# Patient Record
Sex: Female | Born: 1950 | Race: White | Hispanic: No | Marital: Married | Smoking: Never smoker
Health system: Southern US, Community
[De-identification: ages and names within clinical notes are randomized; demographics above are authoritative.]

## PROBLEM LIST (undated history)

## (undated) DIAGNOSIS — R51 Headache: Secondary | ICD-10-CM

## (undated) DIAGNOSIS — R519 Headache, unspecified: Secondary | ICD-10-CM

## (undated) DIAGNOSIS — G939 Disorder of brain, unspecified: Secondary | ICD-10-CM

## (undated) DIAGNOSIS — F329 Major depressive disorder, single episode, unspecified: Secondary | ICD-10-CM

## (undated) DIAGNOSIS — M810 Age-related osteoporosis without current pathological fracture: Secondary | ICD-10-CM

## (undated) DIAGNOSIS — S065XAA Traumatic subdural hemorrhage with loss of consciousness status unknown, initial encounter: Secondary | ICD-10-CM

## (undated) DIAGNOSIS — S065X9A Traumatic subdural hemorrhage with loss of consciousness of unspecified duration, initial encounter: Secondary | ICD-10-CM

## (undated) DIAGNOSIS — F419 Anxiety disorder, unspecified: Secondary | ICD-10-CM

## (undated) DIAGNOSIS — F32A Depression, unspecified: Secondary | ICD-10-CM

## (undated) HISTORY — PX: TONSILLECTOMY: SUR1361

---

## 2016-08-09 DIAGNOSIS — Z124 Encounter for screening for malignant neoplasm of cervix: Secondary | ICD-10-CM | POA: Diagnosis not present

## 2016-08-09 DIAGNOSIS — N952 Postmenopausal atrophic vaginitis: Secondary | ICD-10-CM | POA: Diagnosis not present

## 2016-08-09 DIAGNOSIS — Z681 Body mass index (BMI) 19 or less, adult: Secondary | ICD-10-CM | POA: Diagnosis not present

## 2016-08-14 DIAGNOSIS — L309 Dermatitis, unspecified: Secondary | ICD-10-CM | POA: Diagnosis not present

## 2016-09-01 DIAGNOSIS — M810 Age-related osteoporosis without current pathological fracture: Secondary | ICD-10-CM | POA: Diagnosis not present

## 2016-11-22 DIAGNOSIS — Z136 Encounter for screening for cardiovascular disorders: Secondary | ICD-10-CM | POA: Diagnosis not present

## 2016-11-22 DIAGNOSIS — Z23 Encounter for immunization: Secondary | ICD-10-CM | POA: Diagnosis not present

## 2016-11-22 DIAGNOSIS — M810 Age-related osteoporosis without current pathological fracture: Secondary | ICD-10-CM | POA: Diagnosis not present

## 2016-11-22 DIAGNOSIS — Z0000 Encounter for general adult medical examination without abnormal findings: Secondary | ICD-10-CM | POA: Diagnosis not present

## 2016-11-22 DIAGNOSIS — Z131 Encounter for screening for diabetes mellitus: Secondary | ICD-10-CM | POA: Diagnosis not present

## 2017-01-15 DIAGNOSIS — M810 Age-related osteoporosis without current pathological fracture: Secondary | ICD-10-CM | POA: Diagnosis not present

## 2017-01-15 DIAGNOSIS — Z1231 Encounter for screening mammogram for malignant neoplasm of breast: Secondary | ICD-10-CM | POA: Diagnosis not present

## 2017-05-08 DIAGNOSIS — F419 Anxiety disorder, unspecified: Secondary | ICD-10-CM | POA: Diagnosis not present

## 2017-05-08 DIAGNOSIS — F410 Panic disorder [episodic paroxysmal anxiety] without agoraphobia: Secondary | ICD-10-CM | POA: Diagnosis not present

## 2017-05-08 DIAGNOSIS — R42 Dizziness and giddiness: Secondary | ICD-10-CM | POA: Diagnosis not present

## 2017-05-14 DIAGNOSIS — H2513 Age-related nuclear cataract, bilateral: Secondary | ICD-10-CM | POA: Diagnosis not present

## 2017-05-14 DIAGNOSIS — H16143 Punctate keratitis, bilateral: Secondary | ICD-10-CM | POA: Diagnosis not present

## 2017-05-14 DIAGNOSIS — H25013 Cortical age-related cataract, bilateral: Secondary | ICD-10-CM | POA: Diagnosis not present

## 2017-06-12 DIAGNOSIS — E7800 Pure hypercholesterolemia, unspecified: Secondary | ICD-10-CM | POA: Diagnosis not present

## 2017-06-12 DIAGNOSIS — F419 Anxiety disorder, unspecified: Secondary | ICD-10-CM | POA: Diagnosis not present

## 2017-06-14 DIAGNOSIS — L812 Freckles: Secondary | ICD-10-CM | POA: Diagnosis not present

## 2017-06-14 DIAGNOSIS — D229 Melanocytic nevi, unspecified: Secondary | ICD-10-CM | POA: Diagnosis not present

## 2017-06-14 DIAGNOSIS — L738 Other specified follicular disorders: Secondary | ICD-10-CM | POA: Diagnosis not present

## 2017-06-14 DIAGNOSIS — L814 Other melanin hyperpigmentation: Secondary | ICD-10-CM | POA: Diagnosis not present

## 2017-07-25 DIAGNOSIS — F419 Anxiety disorder, unspecified: Secondary | ICD-10-CM | POA: Diagnosis not present

## 2017-07-25 DIAGNOSIS — M810 Age-related osteoporosis without current pathological fracture: Secondary | ICD-10-CM | POA: Diagnosis not present

## 2017-07-25 DIAGNOSIS — M25552 Pain in left hip: Secondary | ICD-10-CM | POA: Diagnosis not present

## 2017-08-14 DIAGNOSIS — N952 Postmenopausal atrophic vaginitis: Secondary | ICD-10-CM | POA: Diagnosis not present

## 2017-08-14 DIAGNOSIS — N3945 Continuous leakage: Secondary | ICD-10-CM | POA: Diagnosis not present

## 2017-08-14 DIAGNOSIS — Z01419 Encounter for gynecological examination (general) (routine) without abnormal findings: Secondary | ICD-10-CM | POA: Diagnosis not present

## 2017-08-14 DIAGNOSIS — Z681 Body mass index (BMI) 19 or less, adult: Secondary | ICD-10-CM | POA: Diagnosis not present

## 2017-08-28 DIAGNOSIS — R1903 Right lower quadrant abdominal swelling, mass and lump: Secondary | ICD-10-CM | POA: Diagnosis not present

## 2017-09-07 DIAGNOSIS — H538 Other visual disturbances: Secondary | ICD-10-CM | POA: Diagnosis not present

## 2017-09-08 ENCOUNTER — Encounter (HOSPITAL_COMMUNITY): Payer: Self-pay

## 2017-09-08 ENCOUNTER — Emergency Department (HOSPITAL_COMMUNITY)
Admission: EM | Admit: 2017-09-08 | Discharge: 2017-09-08 | Disposition: A | Discharge status: Home / Self Care | Payer: Medicare Other | Attending: Emergency Medicine | Admitting: Emergency Medicine

## 2017-09-08 ENCOUNTER — Emergency Department (HOSPITAL_COMMUNITY): Payer: Medicare Other

## 2017-09-08 ENCOUNTER — Other Ambulatory Visit: Payer: Self-pay

## 2017-09-08 DIAGNOSIS — H53129 Transient visual loss, unspecified eye: Secondary | ICD-10-CM | POA: Insufficient documentation

## 2017-09-08 DIAGNOSIS — H539 Unspecified visual disturbance: Secondary | ICD-10-CM | POA: Diagnosis not present

## 2017-09-08 DIAGNOSIS — D164 Benign neoplasm of bones of skull and face: Secondary | ICD-10-CM | POA: Diagnosis not present

## 2017-09-08 DIAGNOSIS — Z79899 Other long term (current) drug therapy: Secondary | ICD-10-CM | POA: Diagnosis not present

## 2017-09-08 DIAGNOSIS — H538 Other visual disturbances: Secondary | ICD-10-CM | POA: Insufficient documentation

## 2017-09-08 DIAGNOSIS — G453 Amaurosis fugax: Secondary | ICD-10-CM | POA: Diagnosis not present

## 2017-09-08 DIAGNOSIS — H04123 Dry eye syndrome of bilateral lacrimal glands: Secondary | ICD-10-CM | POA: Diagnosis not present

## 2017-09-08 HISTORY — DX: Age-related osteoporosis without current pathological fracture: M81.0

## 2017-09-08 LAB — CBC
HEMATOCRIT: 37.4 % (ref 36.0–46.0)
HEMOGLOBIN: 12.1 g/dL (ref 12.0–15.0)
MCH: 31.3 pg (ref 26.0–34.0)
MCHC: 32.4 g/dL (ref 30.0–36.0)
MCV: 96.6 fL (ref 78.0–100.0)
Platelets: 297 10*3/uL (ref 150–400)
RBC: 3.87 MIL/uL (ref 3.87–5.11)
RDW: 11.8 % (ref 11.5–15.5)
WBC: 6.1 10*3/uL (ref 4.0–10.5)

## 2017-09-08 LAB — DIFFERENTIAL
Abs Immature Granulocytes: 0 10*3/uL (ref 0.0–0.1)
BASOS ABS: 0 10*3/uL (ref 0.0–0.1)
Basophils Relative: 0 %
Eosinophils Absolute: 0.1 10*3/uL (ref 0.0–0.7)
Eosinophils Relative: 1 %
IMMATURE GRANULOCYTES: 0 %
LYMPHS ABS: 0.9 10*3/uL (ref 0.7–4.0)
Lymphocytes Relative: 15 %
Monocytes Absolute: 0.5 10*3/uL (ref 0.1–1.0)
Monocytes Relative: 8 %
NEUTROS ABS: 4.6 10*3/uL (ref 1.7–7.7)
Neutrophils Relative %: 76 %

## 2017-09-08 LAB — I-STAT CHEM 8, ED
BUN: 16 mg/dL (ref 8–23)
CHLORIDE: 104 mmol/L (ref 98–111)
CREATININE: 0.7 mg/dL (ref 0.44–1.00)
Calcium, Ion: 1.2 mmol/L (ref 1.15–1.40)
GLUCOSE: 165 mg/dL — AB (ref 70–99)
HCT: 34 % — ABNORMAL LOW (ref 36.0–46.0)
Hemoglobin: 11.6 g/dL — ABNORMAL LOW (ref 12.0–15.0)
Potassium: 3.8 mmol/L (ref 3.5–5.1)
Sodium: 141 mmol/L (ref 135–145)
TCO2: 24 mmol/L (ref 22–32)

## 2017-09-08 LAB — COMPREHENSIVE METABOLIC PANEL
ALBUMIN: 3.7 g/dL (ref 3.5–5.0)
ALT: 16 U/L (ref 0–44)
AST: 19 U/L (ref 15–41)
Alkaline Phosphatase: 49 U/L (ref 38–126)
Anion gap: 8 (ref 5–15)
BILIRUBIN TOTAL: 0.8 mg/dL (ref 0.3–1.2)
BUN: 14 mg/dL (ref 8–23)
CHLORIDE: 106 mmol/L (ref 98–111)
CO2: 26 mmol/L (ref 22–32)
CREATININE: 0.85 mg/dL (ref 0.44–1.00)
Calcium: 9.3 mg/dL (ref 8.9–10.3)
GFR calc Af Amer: >60 mL/min (ref >60)
GFR calc non Af Amer: >60 mL/min (ref >60)
Glucose, Bld: 170 mg/dL — ABNORMAL HIGH (ref 70–99)
Potassium: 3.9 mmol/L (ref 3.5–5.1)
SODIUM: 140 mmol/L (ref 135–145)
Total Protein: 6.3 g/dL — ABNORMAL LOW (ref 6.5–8.1)

## 2017-09-08 LAB — I-STAT TROPONIN, ED: Troponin i, poc: 0.04 ng/mL (ref 0.00–0.08)

## 2017-09-08 LAB — APTT: APTT: 39 s — AB (ref 24–36)

## 2017-09-08 LAB — PROTIME-INR
INR: 1.04
Prothrombin Time: 13.5 seconds (ref 11.4–15.2)

## 2017-09-08 MED ORDER — GADOBENATE DIMEGLUMINE 529 MG/ML IV SOLN
10.0000 mL | Freq: Once | INTRAVENOUS | Status: AC | PRN
  Administered 2017-09-08: 10 mL via INTRAVENOUS

## 2017-09-08 MED ORDER — ASPIRIN 81 MG PO CHEW
81.0000 mg | CHEWABLE_TABLET | Freq: Once | ORAL | Status: AC
  Administered 2017-09-08: 81 mg via ORAL
  Filled 2017-09-08: qty 1

## 2017-09-08 NOTE — ED Provider Notes (Signed)
[facility] Provider Note   CSN: [id number] Arrival date & time: 09/08/17  1108     History   Chief Complaint Chief Complaint  Patient presents with  . Blurred Vision    HPI Jamie Kirby is a 67 y.o. female.  HPI Patient presents after an episode of blurry vision. The episode was yesterday, occurred without precipitant, lasted about 3 minutes. There was seemingly left-sided, though she is not 100% sure of that. There is no vision loss, nor double vision. She denies other concurrent changes, including extremity weakness, confusion, speech difficulty. Symptoms resolved without intervention. Today she went to her ophthalmologist, and after a full examination was sent here for additional evaluation and management. Notes from her ophthalmologist office state concern for amaurosis fugax, 20/20 vision bilaterally.  She is here with her husband who also assists with the HPI. Past Medical History:  Diagnosis Date  . Osteoporosis     There are no active problems to display for this patient.   History reviewed. No pertinent surgical history.   OB History   None      Home Medications    Prior to Admission medications   Medication Sig Start Date End Date Taking? Authorizing Provider  Hypromellose (GENTEAL MILD) 0.2 % SOLN Place 1 drop into both eyes 2 (two) times daily.   Yes [provider]  Multiple Vitamin (MULTIVITAMIN) capsule Take 1 capsule by mouth daily. Calcium and Vitamin D   Yes [provider]  raloxifene (EVISTA) 60 MG tablet Take 60 mg by mouth every other day.   Yes [provider]  sertraline (ZOLOFT) 25 MG tablet Take 25 mg by mouth at bedtime.   Yes [provider]    Family History History reviewed. No pertinent family history.  Social History Social History   Tobacco Use  . Smoking status: Never Smoker  . Smokeless tobacco: Never Used  Substance Use Topics  . Alcohol use:  Not on file  . Drug use: Not on file     Allergies   Ceclor [cefaclor]   Review of Systems Review of Systems  Constitutional:       Per HPI, otherwise negative  HENT:       Per HPI, otherwise negative  Eyes: Positive for visual disturbance.  Respiratory:       Per HPI, otherwise negative  Cardiovascular:       Per HPI, otherwise negative  Gastrointestinal: Negative for vomiting.  Endocrine:       Negative aside from HPI  Genitourinary:       Neg aside from HPI   Musculoskeletal:       Per HPI, otherwise negative  Skin: Negative.   Neurological: Negative for syncope.     Physical Exam Updated Vital Signs BP 127/76 (BP Location: Right Arm)   Pulse 82   Temp 98.4 F (36.9 C) (Oral)   Resp 16   Ht 5\' 1"  (1.549 m)   Wt 46.7 kg (103 lb)   SpO2 100%   BMI 19.46 kg/m   Physical Exam  Constitutional: She is oriented to person, place, and time. She appears well-developed and well-nourished. No distress.  HENT:  Head: Normocephalic and atraumatic.  Eyes: Conjunctivae and EOM are normal.  Cardiovascular: Normal rate and regular rhythm.  Pulmonary/Chest: Effort normal and breath sounds normal. No stridor. No respiratory distress.  Abdominal: She exhibits no distension.  Musculoskeletal: She exhibits no edema.  Neurological: She is alert and oriented to person, place,  and time. No cranial nerve deficit. She exhibits normal muscle tone. Coordination normal.  Skin: Skin is warm and dry.  Psychiatric: She has a normal mood and affect.  Nursing note and vitals reviewed.    ED Treatments / Results  Labs (all labs ordered are listed, but only abnormal results are displayed) Labs Reviewed  APTT - Abnormal; Notable for the following components:      Result Value   aPTT 39 (*)    All other components within normal limits  COMPREHENSIVE METABOLIC PANEL - Abnormal; Notable for the following components:   Glucose, Bld 170 (*)    Total Protein 6.3 (*)    All other  components within normal limits  I-STAT CHEM 8, ED - Abnormal; Notable for the following components:   Glucose, Bld 165 (*)    Hemoglobin 11.6 (*)    HCT 34.0 (*)    All other components within normal limits  PROTIME-INR  CBC  DIFFERENTIAL  I-STAT TROPONIN, ED    EKG EKG Interpretation  Date/Time:  Saturday September 08 2017 11:56:12 EDT Ventricular Rate:  90 PR Interval:    QRS Duration: 101 QT Interval:  369 QTC Calculation: 452 R Axis:   76 Text Interpretation:  Sinus rhythm RSR' in V1 or V2, right VCD or RVH Otherwise within normal limits Confirmed by Carmin Muskrat [phone]) on 09/08/2017 12:52:07 PM   Radiology Ct Head Wo Contrast  Result Date: 09/08/2017 CLINICAL DATA:  Blurred vision left eye, transient EXAM: CT HEAD WITHOUT CONTRAST TECHNIQUE: Contiguous axial images were obtained from the base of the skull through the vertex without intravenous contrast. COMPARISON:  None. FINDINGS: Brain: The ventricles are normal in size and configuration. There is bilateral frontal atrophy as well as a degree of cerebellar atrophy. There is no intracranial mass, hemorrhage, extra-axial fluid collection, or midline shift. Gray-white compartments appear normal. No acute infarct evident. Vascular: No hyperdense vessel. No appreciable vascular calcifications. Skull: The bony calvarium appears intact. Sinuses/Orbits: There is opacification of multiple ethmoid air cells posteriorly on the right. There is mild mucosal thickening in the anterior right maxillary antrum. Other visualized paranasal sinuses are clear. Visualized orbits appear symmetric bilaterally. Other: Mastoid air cells are clear. IMPRESSION: Frontal lobe and cerebellar atrophy. Ventricles normal in size and configuration. No mass or hemorrhage. Gray-white compartments appear normal. Areas of paranasal sinus disease noted. Electronically Signed   By: Lowella Grip III M.D.   On: 09/08/2017 11:54    Procedures Procedures (including  critical care time)  Medications Ordered in ED Medications - No data to display   Initial Impression / Assessment and Plan / ED Course  I have reviewed the triage vital signs and the nursing notes.  Pertinent labs & imaging results that were available during my care of the patient were reviewed by me and considered in my medical decision making (see chart for details).    After my initial evaluation I discussed the patient's case with our neurology colleagues. We discussed the unusual presentation, concern for amaurosis fugax versus transient blurry vision. Patient remains without neurologic complaints, without vision changes currently. Recommendation is for MR, MRA head and neck, and if findings are normal patient can be discharged with outpatient neurology follow-up, [facility] care follow-up for echocardiogram. Patient will start aspirin, 81 mg, regardless.   3:53 PM Patient remains awake and alert, no neuro complaints, no neuro deficits. I discussed findings thus far with the patient and her husband. Patient is awaiting MRI, understands disposition possibilities with admission  for abnormal MRI, discharge with normal MRI/MRA, with outpatient neurology and primary care follow-up. Dr. Reather Converse will follow-up on MRI results and dispo patient.  Final Clinical Impressions(s) / ED Diagnoses  Vision change   Carmin Muskrat, MD 09/08/17 1554

## 2017-09-08 NOTE — ED Notes (Signed)
Patient transported to MRI 

## 2017-09-08 NOTE — ED Triage Notes (Signed)
Pt presents with sudden onset of blurred vision to L eye that occurred yesterday.  Pt was seen at [facility] and by her ophthalmologist this morning and referred here to t/o ischemia.  Pt reports blurred vision lasted approximately 2-3 minutes, denies any at present.

## 2017-09-08 NOTE — Discharge Instructions (Addendum)
As discussed, today's evaluation has been generally reassuring, but with episode of visual disturbance it is important he follow-up with your primary care physician, and arrange an echocardiogram this week. Have MRI or PET scan of brain done in 4 to 6 wks.   Please continue taking aspirin, 81 mg daily.  Return here for concerning changes in your condition.  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 Short-term follow-up brain MRI (without and with contrast) is recommended in 4-6 weeks. Additionally, if the patient has a history of malignancy, nuclear medicine bone scan is recommended to evaluate for skeletal metastatic disease.

## 2017-09-08 NOTE — ED Notes (Signed)
Patient transported to CT 

## 2017-09-08 NOTE — ED Notes (Signed)
Pt sent from ophthalmologist office-- sent there from private dr's office-- seen yesterday at [facility] after having blurry vision - 1 episode - no visual diificulty since then.

## 2017-09-08 NOTE — ED Notes (Addendum)
Pt transported to CT ?

## 2017-09-08 NOTE — ED Notes (Signed)
Patient passed swallow test.  

## 2017-09-11 DIAGNOSIS — G453 Amaurosis fugax: Secondary | ICD-10-CM | POA: Diagnosis not present

## 2017-09-11 DIAGNOSIS — M899 Disorder of bone, unspecified: Secondary | ICD-10-CM | POA: Diagnosis not present

## 2017-09-12 ENCOUNTER — Other Ambulatory Visit (HOSPITAL_COMMUNITY): Payer: Self-pay | Admitting: Family Medicine

## 2017-09-12 DIAGNOSIS — H547 Unspecified visual loss: Secondary | ICD-10-CM

## 2017-09-13 ENCOUNTER — Ambulatory Visit (HOSPITAL_COMMUNITY)
Admission: RE | Admit: 2017-09-13 | Discharge: 2017-09-13 | Disposition: A | Discharge status: Home / Self Care | Payer: Medicare Other | Source: Ambulatory Visit | Attending: Family Medicine | Admitting: Family Medicine

## 2017-09-13 DIAGNOSIS — H547 Unspecified visual loss: Secondary | ICD-10-CM

## 2017-09-13 DIAGNOSIS — I340 Nonrheumatic mitral (valve) insufficiency: Secondary | ICD-10-CM | POA: Diagnosis not present

## 2017-09-13 DIAGNOSIS — G453 Amaurosis fugax: Secondary | ICD-10-CM | POA: Diagnosis not present

## 2017-09-13 NOTE — Progress Notes (Signed)
  Echocardiogram 2D Echocardiogram has been performed.  Jamie Kirby Jamie Kirby 09/13/2017, 9:30 AM

## 2017-09-18 ENCOUNTER — Telehealth: Payer: Self-pay | Admitting: Neurology

## 2017-09-18 ENCOUNTER — Ambulatory Visit (INDEPENDENT_AMBULATORY_CARE_PROVIDER_SITE_OTHER): Payer: Medicare Other | Admitting: Neurology

## 2017-09-18 ENCOUNTER — Encounter: Payer: Self-pay | Admitting: Neurology

## 2017-09-18 VITALS — BP 104/66 | HR 84 | Ht 61.5 in | Wt 102.0 lb

## 2017-09-18 DIAGNOSIS — G43109 Migraine with aura, not intractable, without status migrainosus: Secondary | ICD-10-CM

## 2017-09-18 DIAGNOSIS — H539 Unspecified visual disturbance: Secondary | ICD-10-CM | POA: Diagnosis not present

## 2017-09-18 DIAGNOSIS — R9089 Other abnormal findings on diagnostic imaging of central nervous system: Secondary | ICD-10-CM

## 2017-09-18 MED ORDER — ALPRAZOLAM 0.5 MG PO TABS: ORAL_TABLET | ORAL | 0 refills | Status: AC

## 2017-09-18 NOTE — Patient Instructions (Signed)
Your exam looks good.  For completion, we will repeat your brain scan, called MRI and call you with the test results. We will have to schedule you for this on a separate date. This test requires authorization from your insurance, and we will take care of the insurance process. I would suggest you get your cholesterol checked with your primary care doctor and continue with the baby aspirin.

## 2017-09-18 NOTE — Telephone Encounter (Signed)
Correction patient is scheduled at [facility] for 10/19/17 arrival time is 10:30 AM. Patient is aware of time & day. The scheduler at [facility] informed me that a BUN and Creatine lab order needs to be put in and they will check that the day of her appointment.

## 2017-09-18 NOTE — Progress Notes (Signed)
Subjective:    Patient ID: Jamie Kirby is a 67 y.o. female.  HPI     [name], MD, [name] [facility] [address], [address]O. [place], [place] [address]  Dear Dr. Lynelle Doctor,   I saw your patient, Jamie Kirby, upon your kind request, in my neurologic clinic today for initial consultation of her Hx of suspected TIA with suspected amaurosis fugax. The patient is accompanied by her husband today. As you know, Jamie Kirby is a 67 year old right-handed woman with an underlying medical history of osteoporosis and anxiety, who recently presented to the emergency room with transient blurry vision. She reports very brief symptoms that happen on a Friday. She is not sure if it was only her left eye or left visual fields. Symptoms lasted for less than 5 minutes, maybe around 3 minutes and she describes not actual blurriness or loss of vision but a skewed vision. She did not have any double vision and did not show any shadows of blackness or abnormal scintillating scotomata. She has had visual auras before and used to have migraines but also infrequently gets visual symptoms without a subsequent headache. She did not have any headache that time and feels that her eye symptoms were different from her visual auras in the past. She had no one-sided weakness or numbness or slurring of speech or droopy face. She presented to her eye doctor the next day which was a Saturday and was prompted by him to seek immediate medical attention at the emergency room. I reviewed the emergency room records. She had a MRI and MRA head on 09/08/2017 without contrast. She had an unremarkable brain MRI, was found to have a left occipital skull lesion of unknown significance, negative MRA head. 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 She had a recent echocardiogram on 09/13/2017 which showed normal LV function and mild MR. She was advised to  start on baby aspirin. Lab work and EKG in the ER where unremarkable. I reviewed your office note from 09/11/2017. She is a nonsmoker. Retired and lives with her husband. They have 2 children. She drinks alcohol socially and caffeine in the form of coffee, 2 cups per day on average.  She feels well, reports that she previously had an MRI some 10 years ago or 12 years ago, no abnormality noted per her report at the time.  Her Past Medical History Is Significant For: Past Medical History:  Diagnosis Date  . Osteoporosis     Her Past Surgical History Is Significant For: Past Surgical History:  Procedure Laterality Date  . TONSILLECTOMY      Her Family History Is Significant For: No family history on file.  Her Social History Is Significant For: Social History   Socioeconomic History  . Marital status: Married    Spouse name: Not on file  . Number of children: Not on file  . Years of education: Not on file  . Highest education level: Not on file  Occupational History  . Not on file  Social Needs  . Financial resource strain: Not on file  . Food insecurity:    Worry: Not on file    Inability: Not on file  . Transportation needs:    Medical: Not on file    Non-medical: Not on file  Tobacco Use  . Smoking status: Never Smoker  . Smokeless tobacco: Never Used  Substance and Sexual Activity  .  Alcohol use: Not on file  . Drug use: Not on file  . Sexual activity: Not on file  Lifestyle  . Physical activity:    Days per week: Not on file    Minutes per session: Not on file  . Stress: Not on file  Relationships  . Social connections:    Talks on phone: Not on file    Gets together: Not on file    Attends religious service: Not on file    Active member of club or organization: Not on file    Attends meetings of clubs or organizations: Not on file    Relationship status: Not on file  Other Topics Concern  . Not on file  Social History Narrative  . Not on file    Her  Allergies Are:  Allergies  Allergen Reactions  . Ceclor [Cefaclor] Rash  :   Her Current Medications Are:  Outpatient Encounter Medications as of 09/18/2017  Medication Sig  . Hypromellose (GENTEAL MILD) 0.2 % SOLN Place 1 drop into both eyes 2 (two) times daily.  . Multiple Vitamin (MULTIVITAMIN) capsule Take 1 capsule by mouth daily. Calcium and Vitamin D  . raloxifene (EVISTA) 60 MG tablet Take 60 mg by mouth every other day.  . sertraline (ZOLOFT) 25 MG tablet Take 25 mg by mouth at bedtime.   No facility-administered encounter medications on file as of 09/18/2017.   : Review of Systems:  Out of a complete 14 point review of systems, all are reviewed and negative with the exception of these symptoms as listed below: Review of Systems  Neurological:       Pt presents today to discuss her transient visual disturbance. Pt has had imaging of her head. Pt needs to establish care with a neurologist.    Objective:  Neurological Exam  Physical Exam Physical Examination:   Vitals:   09/18/17 1351  BP: 104/66  Pulse: 84    General Examination: The patient is a very pleasant 67 y.o. female in no acute distress. She appears well-developed and well-nourished and well groomed. Slender.   HEENT: Normocephalic, atraumatic, pupils are equal, round and reactive to light and accommodation. Corrective eyeglasses in place. Funduscopic exam is normal with sharp disc margins noted. Extraocular tracking is good without limitation to gaze excursion or nystagmus noted. Normal smooth pursuit is noted. Hearing is grossly intact. Face is symmetric with normal facial animation and normal facial sensation. Speech is clear with no dysarthria noted. There is no hypophonia. There is no lip, neck/head, jaw or voice tremor. Neck is supple with full range of passive and active motion. There are no carotid bruits on auscultation. Oropharynx exam reveals: mild mouth dryness, adequate dental hygiene and mild airway  crowding, due to smaller airway. Mallampati is class II. Tongue protrudes centrally and palate elevates symmetrically. Tonsils are absent.   Chest: Clear to auscultation without wheezing, rhonchi or crackles noted.  Heart: S1+S2+0, regular and normal without murmurs, rubs or gallops noted.   Abdomen: Soft, non-tender and non-distended with normal bowel sounds appreciated on auscultation.  Extremities: There is no pitting edema in the distal lower extremities bilaterally. Pedal pulses are intact.  Skin: Warm and dry without trophic changes noted.  Musculoskeletal: exam reveals no obvious joint deformities, tenderness or joint swelling or erythema.   Neurologically:  Mental status: The patient is awake, alert and oriented in all 4 spheres. Her immediate and remote memory, attention, language skills and fund of knowledge are appropriate. There is no evidence  of aphasia, agnosia, apraxia or anomia. Speech is clear with normal prosody and enunciation. Thought process is linear. Mood is normal and affect is normal.  Cranial nerves II - XII are as described above under HEENT exam. In addition: shoulder shrug is normal with equal shoulder height noted. Motor exam: Normal bulk, strength and tone is noted. There is no drift, tremor or rebound. Romberg is negative. Reflexes are 2+ throughout. Babinski: Toes are flexor bilaterally. Fine motor skills and coordination: intact with normal finger taps, normal hand movements, normal rapid alternating patting, normal foot taps and normal foot agility.  Cerebellar testing: No dysmetria or intention tremor on finger to nose testing. Heel to shin is unremarkable bilaterally. There is no truncal or gait ataxia.  Sensory exam: intact to light touch, vibration, temperature sense in the upper and lower extremities.  Gait, station and balance: She stands easily. No veering to one side is noted. No leaning to one side is noted. Posture is age-appropriate and stance is  narrow based. Gait shows normal stride length and pace, turns normally. Tandem walk is unremarkable.    Assessment and Plan:   In summary, Anielle Headrick is a very pleasant 67 y.o.-year old female with an underlying benign medical history of no significant vascular risk factors who presents for evaluation for a transient episode of visual disturbance. Description is not very classic for amaurosis fugax. Nevertheless, she had a full TIA workup. She was found to have a left occipital skull based lesion which we will have to follow up on. I recommended a repeat MRI in about a month from now, with and without contrast. She is somewhat apprehensive about it. She would be willing to try low-dose a next prior to scan. Her neurological exam is nonfocal and she has had no additional symptoms as well is a benign eye exam recently. She is advised to make sure her cholesterol level has been checked and continue with baby aspirin for prevention. 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 Nevertheless, we will call her with her MRI results and take it from there and may involve the expertise of a neurosurgeon if needed.  I answered all their questions today and the patient and her husband were in agreement. Thank you very much for allowing me to participate in the care of this nice patient. If I can be of any further assistance to you please do not hesitate to call me at [phone].  Sincerely,   [name], MD, [name]

## 2017-09-18 NOTE — Addendum Note (Signed)
Addended by: Lester Jennette A on: 09/18/2017 03:45 PM   Modules accepted: Orders

## 2017-09-18 NOTE — Telephone Encounter (Signed)
Received VO from Dr. Rexene Alberts to order BUN and creatinine to be checked prior to pt's MRI in August. Order placed.

## 2017-09-18 NOTE — Telephone Encounter (Signed)
Medicare/aarp order sent to GI. No auth they will reach out to the pt to schedule.  °

## 2017-10-19 ENCOUNTER — Ambulatory Visit (HOSPITAL_COMMUNITY)
Admission: RE | Admit: 2017-10-19 | Discharge: 2017-10-19 | Disposition: A | Discharge status: Home / Self Care | Payer: Medicare Other | Source: Ambulatory Visit | Attending: Neurology | Admitting: Neurology

## 2017-10-19 DIAGNOSIS — M899 Disorder of bone, unspecified: Secondary | ICD-10-CM | POA: Insufficient documentation

## 2017-10-19 DIAGNOSIS — H539 Unspecified visual disturbance: Secondary | ICD-10-CM

## 2017-10-19 DIAGNOSIS — R9089 Other abnormal findings on diagnostic imaging of central nervous system: Secondary | ICD-10-CM | POA: Insufficient documentation

## 2017-10-19 DIAGNOSIS — G43109 Migraine with aura, not intractable, without status migrainosus: Secondary | ICD-10-CM | POA: Diagnosis not present

## 2017-10-19 DIAGNOSIS — D164 Benign neoplasm of bones of skull and face: Secondary | ICD-10-CM | POA: Diagnosis not present

## 2017-10-19 MED ORDER — GADOBENATE DIMEGLUMINE 529 MG/ML IV SOLN
15.0000 mL | Freq: Once | INTRAVENOUS | Status: AC
  Administered 2017-10-19: 10 mL via INTRAVENOUS

## 2017-10-22 ENCOUNTER — Telehealth: Payer: Self-pay

## 2017-10-22 DIAGNOSIS — R9089 Other abnormal findings on diagnostic imaging of central nervous system: Secondary | ICD-10-CM

## 2017-10-22 NOTE — Progress Notes (Signed)
Repeat Brain MRI shows skull lesion at the same area in the back of her head on the L. Report suggests size is the same. No clear source. Patient should FU with PCP regarding underlying conditions, that can lead to skull lesions, including w/u for underlying malignancy. That is not to say, that the lesion is cancerous, but we don't know what it is either. I would also like to suggest evaluation/consultation with NSG. If pt is okay with this, we can put in referral to neurosurg for eval/treatment of occip skull lesion, maybe a Bx would be feasible. Please call patient to discuss and let me know.  Michel Bickers

## 2017-10-22 NOTE — Telephone Encounter (Signed)
I called pt and explained her MRI results. Pt will follow up with her PCP to discuss her underlying conditions that can lead to skull lesions, including work up for underlying malignancy. I advised her that this does not mean that his is cancer necessarily but we don't know what the lesion is either. Dr. Rexene Alberts also suggests a neurosurgery consult to see if a biopsy is feasible on this lesion. Pt is agreeable to this referral and understands that the [place] office will call her to schedule this appt. Pt verbalized understanding of results. Pt had no questions at this time but was encouraged to call back if questions arise.

## 2017-10-22 NOTE — Telephone Encounter (Signed)
-----   Message from [name], MD sent at 10/22/2017  8:01 AM EDT ----- Repeat Brain MRI shows skull lesion at the same area in the back of her head on the L. Report suggests size is the same. No clear source. Patient should FU with PCP regarding underlying conditions, that can lead to skull lesions, including w/u for underlying malignancy. That is not to say, that the lesion is cancerous, but we don't know what it is either. I would also like to suggest evaluation/consultation with NSG. If pt is okay with this, we can put in referral to neurosurg for eval/treatment of occip skull lesion, maybe a Bx would be feasible. Please call patient to discuss and let me know.  Michel Bickers

## 2017-10-22 NOTE — Telephone Encounter (Signed)
Sent to [facility] via fax . I spoke to patient she is going to pick up  her MRI CD from [facility]. Patient is aware [facility] will be calling -her. [phone] - fax [phone]

## 2017-11-08 ENCOUNTER — Other Ambulatory Visit: Payer: Self-pay | Admitting: Radiation Therapy

## 2017-11-08 DIAGNOSIS — M899 Disorder of bone, unspecified: Secondary | ICD-10-CM | POA: Diagnosis not present

## 2017-11-08 DIAGNOSIS — Z681 Body mass index (BMI) 19 or less, adult: Secondary | ICD-10-CM | POA: Diagnosis not present

## 2017-11-08 DIAGNOSIS — R030 Elevated blood-pressure reading, without diagnosis of hypertension: Secondary | ICD-10-CM | POA: Diagnosis not present

## 2017-11-12 ENCOUNTER — Other Ambulatory Visit: Payer: Medicare Other

## 2017-11-14 NOTE — Progress Notes (Signed)
Brain and Spine Tumor Board Documentation  Jamie Kirby was presented by Cecil Cobbs, MD at Brain and Spine Tumor Board on 11/14/2017, which included representatives from neuro oncology, radiation oncology, surgical oncology, radiology, pathology, navigation, genetics.  Jamie Kirby was presented as a new patient with history of the following treatments:  .  Additionally, we reviewed previous medical and familial history, history of present illness, and recent lab results along with all available histopathologic and imaging studies. The tumor board considered available treatment options and made the following recommendations:  Biopsy  Tumor board is a meeting of clinicians from various specialty areas who evaluate and discuss patients for whom a multidisciplinary approach is being considered. Final determinations in the plan of care are those of the provider(s). The responsibility for follow up of recommendations given during tumor board is that of the provider.   Today's extended care, comprehensive team conference, Jamie Kirby was not present for the discussion and was not examined.

## 2017-11-21 ENCOUNTER — Other Ambulatory Visit (HOSPITAL_COMMUNITY): Payer: Self-pay | Admitting: Physician Assistant

## 2017-11-21 DIAGNOSIS — M899 Disorder of bone, unspecified: Secondary | ICD-10-CM

## 2017-11-27 DIAGNOSIS — M810 Age-related osteoporosis without current pathological fracture: Secondary | ICD-10-CM | POA: Diagnosis not present

## 2017-11-27 DIAGNOSIS — Z23 Encounter for immunization: Secondary | ICD-10-CM | POA: Diagnosis not present

## 2017-11-27 DIAGNOSIS — Z0000 Encounter for general adult medical examination without abnormal findings: Secondary | ICD-10-CM | POA: Diagnosis not present

## 2017-11-27 DIAGNOSIS — E7800 Pure hypercholesterolemia, unspecified: Secondary | ICD-10-CM | POA: Diagnosis not present

## 2017-11-27 DIAGNOSIS — F419 Anxiety disorder, unspecified: Secondary | ICD-10-CM | POA: Diagnosis not present

## 2017-11-28 ENCOUNTER — Other Ambulatory Visit (HOSPITAL_COMMUNITY): Payer: Self-pay | Admitting: Neurosurgery

## 2017-11-28 DIAGNOSIS — R229 Localized swelling, mass and lump, unspecified: Principal | ICD-10-CM

## 2017-11-28 DIAGNOSIS — IMO0002 Reserved for concepts with insufficient information to code with codable children: Secondary | ICD-10-CM

## 2017-11-29 ENCOUNTER — Other Ambulatory Visit (HOSPITAL_COMMUNITY): Payer: Self-pay | Admitting: Neurosurgery

## 2017-11-29 ENCOUNTER — Other Ambulatory Visit: Payer: Self-pay | Admitting: Radiation Therapy

## 2017-11-29 DIAGNOSIS — C799 Secondary malignant neoplasm of unspecified site: Secondary | ICD-10-CM

## 2017-11-29 NOTE — Progress Notes (Signed)
che

## 2017-12-03 ENCOUNTER — Other Ambulatory Visit (HOSPITAL_COMMUNITY): Payer: Self-pay | Admitting: Neurosurgery

## 2017-12-03 DIAGNOSIS — C7951 Secondary malignant neoplasm of bone: Secondary | ICD-10-CM

## 2017-12-05 ENCOUNTER — Ambulatory Visit (HOSPITAL_COMMUNITY)
Admission: RE | Admit: 2017-12-05 | Discharge: 2017-12-05 | Disposition: A | Discharge status: Home / Self Care | Payer: Medicare Other | Source: Ambulatory Visit | Attending: Neurosurgery | Admitting: Neurosurgery

## 2017-12-05 DIAGNOSIS — R1909 Other intra-abdominal and pelvic swelling, mass and lump: Secondary | ICD-10-CM | POA: Diagnosis not present

## 2017-12-05 DIAGNOSIS — S7002XA Contusion of left hip, initial encounter: Secondary | ICD-10-CM | POA: Diagnosis not present

## 2017-12-05 DIAGNOSIS — N839 Noninflammatory disorder of ovary, fallopian tube and broad ligament, unspecified: Secondary | ICD-10-CM | POA: Diagnosis not present

## 2017-12-05 DIAGNOSIS — J9811 Atelectasis: Secondary | ICD-10-CM | POA: Diagnosis not present

## 2017-12-05 DIAGNOSIS — C7951 Secondary malignant neoplasm of bone: Secondary | ICD-10-CM

## 2017-12-05 DIAGNOSIS — C799 Secondary malignant neoplasm of unspecified site: Secondary | ICD-10-CM | POA: Diagnosis present

## 2017-12-05 LAB — POCT I-STAT CREATININE: Creatinine, Ser: 0.7 mg/dL (ref 0.44–1.00)

## 2017-12-05 MED ORDER — IOHEXOL 300 MG/ML  SOLN
100.0000 mL | Freq: Once | INTRAMUSCULAR | Status: AC | PRN
  Administered 2017-12-05: 100 mL via INTRAVENOUS

## 2017-12-05 MED ORDER — SODIUM CHLORIDE 0.9 % IJ SOLN
INTRAMUSCULAR | Status: AC
  Filled 2017-12-05: qty 50

## 2017-12-06 ENCOUNTER — Other Ambulatory Visit: Payer: Self-pay | Admitting: Radiation Therapy

## 2017-12-06 DIAGNOSIS — M899 Disorder of bone, unspecified: Secondary | ICD-10-CM | POA: Diagnosis not present

## 2017-12-10 ENCOUNTER — Other Ambulatory Visit: Payer: Medicare Other

## 2017-12-12 NOTE — Progress Notes (Signed)
Brain and Spine Tumor Board Documentation  Katryna Tschirhart was presented by Cecil Cobbs, MD at Brain and Spine Tumor Board on 12/12/2017, which included representatives from neuro oncology, radiation oncology, surgical oncology, radiology, pathology, navigation.  Tahja was presented as a current patient with history of the following treatments:  .  Additionally, we reviewed previous medical and familial history, history of present illness, and recent lab results along with all available histopathologic and imaging studies. The tumor board considered available treatment options and made the following recommendations:  Additional screening med-onc referral for ovarian mass seen on systemic imaging  Tumor board is a meeting of clinicians from various specialty areas who evaluate and discuss patients for whom a multidisciplinary approach is being considered. Final determinations in the plan of care are those of the provider(s). The responsibility for follow up of recommendations given during tumor board is that of the provider.   Today's extended care, comprehensive team conference, Ronalee was not present for the discussion and was not examined.

## 2017-12-13 DIAGNOSIS — D252 Subserosal leiomyoma of uterus: Secondary | ICD-10-CM | POA: Diagnosis not present

## 2017-12-13 DIAGNOSIS — D251 Intramural leiomyoma of uterus: Secondary | ICD-10-CM | POA: Diagnosis not present

## 2017-12-26 DIAGNOSIS — Z23 Encounter for immunization: Secondary | ICD-10-CM | POA: Diagnosis not present

## 2017-12-27 ENCOUNTER — Other Ambulatory Visit (HOSPITAL_COMMUNITY): Payer: Self-pay | Admitting: Physician Assistant

## 2017-12-27 DIAGNOSIS — M899 Disorder of bone, unspecified: Secondary | ICD-10-CM

## 2018-01-08 ENCOUNTER — Ambulatory Visit (HOSPITAL_COMMUNITY)
Admission: RE | Admit: 2018-01-08 | Discharge: 2018-01-08 | Disposition: A | Discharge status: Home / Self Care | Payer: Medicare Other | Source: Ambulatory Visit | Attending: Physician Assistant | Admitting: Physician Assistant

## 2018-01-08 DIAGNOSIS — M899 Disorder of bone, unspecified: Secondary | ICD-10-CM | POA: Diagnosis not present

## 2018-01-08 DIAGNOSIS — G9389 Other specified disorders of brain: Secondary | ICD-10-CM | POA: Diagnosis not present

## 2018-01-08 MED ORDER — GADOBUTROL 1 MMOL/ML IV SOLN
5.0000 mL | Freq: Once | INTRAVENOUS | Status: AC | PRN
  Administered 2018-01-08: 5 mL via INTRAVENOUS

## 2018-01-23 DIAGNOSIS — Z681 Body mass index (BMI) 19 or less, adult: Secondary | ICD-10-CM | POA: Diagnosis not present

## 2018-01-23 DIAGNOSIS — M899 Disorder of bone, unspecified: Secondary | ICD-10-CM | POA: Diagnosis not present

## 2018-01-29 DIAGNOSIS — Z1231 Encounter for screening mammogram for malignant neoplasm of breast: Secondary | ICD-10-CM | POA: Diagnosis not present

## 2018-04-10 ENCOUNTER — Other Ambulatory Visit (HOSPITAL_COMMUNITY): Payer: Self-pay | Admitting: Neurosurgery

## 2018-04-10 DIAGNOSIS — M899 Disorder of bone, unspecified: Secondary | ICD-10-CM

## 2018-04-25 ENCOUNTER — Ambulatory Visit (HOSPITAL_COMMUNITY)
Admission: RE | Admit: 2018-04-25 | Discharge: 2018-04-25 | Disposition: A | Discharge status: Home / Self Care | Payer: Medicare Other | Source: Ambulatory Visit | Attending: Neurosurgery | Admitting: Neurosurgery

## 2018-04-25 DIAGNOSIS — M899 Disorder of bone, unspecified: Secondary | ICD-10-CM | POA: Insufficient documentation

## 2018-04-25 DIAGNOSIS — D164 Benign neoplasm of bones of skull and face: Secondary | ICD-10-CM | POA: Diagnosis not present

## 2018-04-25 LAB — CREATININE, SERUM: CREATININE: 0.83 mg/dL (ref 0.44–1.00)

## 2018-04-25 MED ORDER — GADOBUTROL 1 MMOL/ML IV SOLN
5.0000 mL | Freq: Once | INTRAVENOUS | Status: AC | PRN
  Administered 2018-04-25: 5 mL via INTRAVENOUS

## 2018-05-02 DIAGNOSIS — M899 Disorder of bone, unspecified: Secondary | ICD-10-CM | POA: Diagnosis not present

## 2018-05-17 ENCOUNTER — Other Ambulatory Visit: Payer: Self-pay

## 2018-05-17 ENCOUNTER — Inpatient Hospital Stay (HOSPITAL_BASED_OUTPATIENT_CLINIC_OR_DEPARTMENT_OTHER)
Admission: EM | Admit: 2018-05-17 | Discharge: 2018-05-20 | DRG: 066 | Disposition: A | Discharge status: Home / Self Care | Payer: Medicare Other | Attending: Neurosurgery | Admitting: Neurosurgery

## 2018-05-17 ENCOUNTER — Emergency Department (HOSPITAL_BASED_OUTPATIENT_CLINIC_OR_DEPARTMENT_OTHER): Payer: Medicare Other

## 2018-05-17 ENCOUNTER — Encounter (HOSPITAL_BASED_OUTPATIENT_CLINIC_OR_DEPARTMENT_OTHER): Payer: Self-pay | Admitting: Emergency Medicine

## 2018-05-17 DIAGNOSIS — G43909 Migraine, unspecified, not intractable, without status migrainosus: Secondary | ICD-10-CM | POA: Diagnosis present

## 2018-05-17 DIAGNOSIS — M899 Disorder of bone, unspecified: Secondary | ICD-10-CM | POA: Diagnosis not present

## 2018-05-17 DIAGNOSIS — I6201 Nontraumatic acute subdural hemorrhage: Secondary | ICD-10-CM | POA: Diagnosis not present

## 2018-05-17 DIAGNOSIS — I6202 Nontraumatic subacute subdural hemorrhage: Secondary | ICD-10-CM | POA: Diagnosis not present

## 2018-05-17 DIAGNOSIS — M810 Age-related osteoporosis without current pathological fracture: Secondary | ICD-10-CM | POA: Diagnosis present

## 2018-05-17 DIAGNOSIS — R112 Nausea with vomiting, unspecified: Secondary | ICD-10-CM | POA: Diagnosis not present

## 2018-05-17 DIAGNOSIS — Z881 Allergy status to other antibiotic agents status: Secondary | ICD-10-CM | POA: Diagnosis not present

## 2018-05-17 DIAGNOSIS — S065XAA Traumatic subdural hemorrhage with loss of consciousness status unknown, initial encounter: Secondary | ICD-10-CM

## 2018-05-17 DIAGNOSIS — I6200 Nontraumatic subdural hemorrhage, unspecified: Secondary | ICD-10-CM | POA: Diagnosis not present

## 2018-05-17 DIAGNOSIS — S065X0A Traumatic subdural hemorrhage without loss of consciousness, initial encounter: Secondary | ICD-10-CM | POA: Diagnosis not present

## 2018-05-17 DIAGNOSIS — S065X9A Traumatic subdural hemorrhage with loss of consciousness of unspecified duration, initial encounter: Secondary | ICD-10-CM | POA: Diagnosis present

## 2018-05-17 DIAGNOSIS — R51 Headache: Secondary | ICD-10-CM | POA: Diagnosis not present

## 2018-05-17 LAB — MRSA PCR SCREENING: MRSA by PCR: NEGATIVE

## 2018-05-17 MED ORDER — HYDROMORPHONE HCL 1 MG/ML IJ SOLN
0.5000 mg | INTRAMUSCULAR | Status: DC | PRN
  Filled 2018-05-17: qty 1

## 2018-05-17 MED ORDER — OXYCODONE HCL 5 MG PO TABS
5.0000 mg | ORAL_TABLET | ORAL | Status: DC | PRN
  Administered 2018-05-18 – 2018-05-20 (×9): 5 mg via ORAL
  Filled 2018-05-17 (×10): qty 1

## 2018-05-17 MED ORDER — HYPROMELLOSE 0.2 % OP SOLN: 1.0000 [drp] | Freq: Two times a day (BID) | OPHTHALMIC | Status: DC

## 2018-05-17 MED ORDER — SENNA 8.6 MG PO TABS
1.0000 | ORAL_TABLET | Freq: Two times a day (BID) | ORAL | Status: DC
  Administered 2018-05-18 – 2018-05-20 (×5): 8.6 mg via ORAL
  Filled 2018-05-17 (×6): qty 1

## 2018-05-17 MED ORDER — RALOXIFENE HCL 60 MG PO TABS
60.0000 mg | ORAL_TABLET | ORAL | Status: DC
  Administered 2018-05-19: 60 mg via ORAL
  Filled 2018-05-17: qty 1

## 2018-05-17 MED ORDER — PROMETHAZINE HCL 25 MG/ML IJ SOLN: 12.5000 mg | INTRAMUSCULAR | Status: DC | PRN

## 2018-05-17 MED ORDER — HYDROMORPHONE HCL 1 MG/ML IJ SOLN: 1.0000 mg | INTRAMUSCULAR | Status: DC | PRN

## 2018-05-17 MED ORDER — FLEET ENEMA 7-19 GM/118ML RE ENEM: 1.0000 | ENEMA | Freq: Once | RECTAL | Status: DC | PRN

## 2018-05-17 MED ORDER — PROCHLORPERAZINE EDISYLATE 10 MG/2ML IJ SOLN
10.0000 mg | Freq: Once | INTRAMUSCULAR | Status: AC
  Administered 2018-05-17: 10 mg via INTRAVENOUS
  Filled 2018-05-17: qty 2

## 2018-05-17 MED ORDER — SERTRALINE HCL 50 MG PO TABS
25.0000 mg | ORAL_TABLET | Freq: Every day | ORAL | Status: DC
  Administered 2018-05-18 – 2018-05-20 (×2): 25 mg via ORAL
  Filled 2018-05-17 (×4): qty 1

## 2018-05-17 MED ORDER — SODIUM CHLORIDE 0.9 % IV SOLN
INTRAVENOUS | Status: DC
  Administered 2018-05-17: 22:00:00 via INTRAVENOUS

## 2018-05-17 MED ORDER — BISACODYL 5 MG PO TBEC: 5.0000 mg | DELAYED_RELEASE_TABLET | Freq: Every day | ORAL | Status: DC | PRN

## 2018-05-17 MED ORDER — DOCUSATE SODIUM 100 MG PO CAPS
100.0000 mg | ORAL_CAPSULE | Freq: Two times a day (BID) | ORAL | Status: DC
  Administered 2018-05-17 – 2018-05-20 (×6): 100 mg via ORAL
  Filled 2018-05-17 (×6): qty 1

## 2018-05-17 MED ORDER — ONDANSETRON HCL 4 MG/2ML IJ SOLN
4.0000 mg | Freq: Four times a day (QID) | INTRAMUSCULAR | Status: DC | PRN
  Administered 2018-05-18: 4 mg via INTRAVENOUS
  Filled 2018-05-17: qty 2

## 2018-05-17 MED ORDER — DEXAMETHASONE SODIUM PHOSPHATE 10 MG/ML IJ SOLN
10.0000 mg | Freq: Once | INTRAMUSCULAR | Status: AC
  Administered 2018-05-17: 10 mg via INTRAVENOUS
  Filled 2018-05-17: qty 1

## 2018-05-17 MED ORDER — ADULT MULTIVITAMIN W/MINERALS CH
1.0000 | ORAL_TABLET | Freq: Every day | ORAL | Status: DC
  Administered 2018-05-18 – 2018-05-20 (×3): 1 via ORAL
  Filled 2018-05-17 (×3): qty 1

## 2018-05-17 MED ORDER — DIPHENHYDRAMINE HCL 50 MG/ML IJ SOLN
12.5000 mg | Freq: Once | INTRAMUSCULAR | Status: AC
  Administered 2018-05-17: 12.5 mg via INTRAVENOUS
  Filled 2018-05-17: qty 1

## 2018-05-17 MED ORDER — ZOLPIDEM TARTRATE 5 MG PO TABS
5.0000 mg | ORAL_TABLET | Freq: Every evening | ORAL | Status: DC | PRN
  Filled 2018-05-17: qty 1

## 2018-05-17 MED ORDER — ACETAMINOPHEN 650 MG RE SUPP: 650.0000 mg | Freq: Four times a day (QID) | RECTAL | Status: DC | PRN

## 2018-05-17 MED ORDER — ONDANSETRON HCL 4 MG PO TABS: 4.0000 mg | ORAL_TABLET | Freq: Four times a day (QID) | ORAL | Status: DC | PRN

## 2018-05-17 MED ORDER — RALOXIFENE HCL 60 MG PO TABS
60.0000 mg | ORAL_TABLET | ORAL | Status: DC
  Filled 2018-05-17: qty 1

## 2018-05-17 MED ORDER — POLYVINYL ALCOHOL 1.4 % OP SOLN
1.0000 [drp] | Freq: Two times a day (BID) | OPHTHALMIC | Status: DC
  Administered 2018-05-17 – 2018-05-20 (×6): 1 [drp] via OPHTHALMIC
  Filled 2018-05-17: qty 15

## 2018-05-17 MED ORDER — ALPRAZOLAM 0.5 MG PO TABS: 0.5000 mg | ORAL_TABLET | Freq: Two times a day (BID) | ORAL | Status: DC | PRN

## 2018-05-17 MED ORDER — ACETAMINOPHEN 325 MG PO TABS
650.0000 mg | ORAL_TABLET | Freq: Four times a day (QID) | ORAL | Status: DC | PRN
  Administered 2018-05-18 – 2018-05-20 (×2): 650 mg via ORAL
  Filled 2018-05-17 (×2): qty 2

## 2018-05-17 NOTE — ED Notes (Signed)
Patient transported to CT 

## 2018-05-17 NOTE — H&P (Signed)
Chief Complaint   Chief Complaint  Patient presents with   Migraine    HPI   HPI: Jamie Kirby is a 68 y.o. female who has previously been followed by Dr Kathyrn Sheriff for a skull lesion, who presented to [facility] due to headache and nausea/vomiting. No injury. Symptoms started about 2 days ago and have been gradually worsening. Endorses nausea with vomiting anytime she moves. Denies changes in vision. Not on blood thinning agents.  Patient Active Problem List   Diagnosis Date Noted   Subdural hematoma ([place]) 05/17/2018    PMH: Past Medical History:  Diagnosis Date   Osteoporosis     PSH: Past Surgical History:  Procedure Laterality Date   TONSILLECTOMY      (Not in a hospital admission)   SH: Social History   Tobacco Use   Smoking status: Never Smoker   Smokeless tobacco: Never Used  Substance Use Topics   Alcohol use: Not on file   Drug use: Not on file    MEDS: Prior to Admission medications   Medication Sig Start Date End Date Taking? Authorizing Provider  ALPRAZolam Duanne Moron) 0.5 MG tablet Take 1-2 pills as needed on call to MRI. 09/18/17  Yes [name], MD  Hypromellose (GENTEAL MILD) 0.2 % SOLN Place 1 drop into both eyes 2 (two) times daily.   Yes [provider]  Multiple Vitamin (MULTIVITAMIN) capsule Take 1 capsule by mouth daily. Calcium and Vitamin D   Yes [provider]  raloxifene (EVISTA) 60 MG tablet Take 60 mg by mouth every other day.   Yes [provider]  sertraline (ZOLOFT) 25 MG tablet Take 25 mg by mouth at bedtime.   Yes [provider]    ALLERGY: Allergies  Allergen Reactions   Ceclor [Cefaclor] Rash    Social History   Tobacco Use   Smoking status: Never Smoker   Smokeless tobacco: Never Used  Substance Use Topics   Alcohol use: Not on file     History reviewed. No pertinent family history.   ROS   Review of Systems  Constitutional: Negative for chills and  fever.  HENT: Negative.   Eyes: Negative for blurred vision, double vision and photophobia.  Respiratory: Negative.   Cardiovascular: Negative.   Gastrointestinal: Positive for nausea and vomiting.  Genitourinary: Negative.   Musculoskeletal: Negative.   Skin: Negative.   Neurological: Positive for dizziness and headaches. Negative for tingling, tremors, sensory change, speech change, focal weakness, seizures, loss of consciousness and weakness.    Exam   Vitals:   05/17/18 1337 05/17/18 1531  BP: 132/69 129/77  Pulse: 73 93  Resp: 14 16  Temp: 98.1 F (36.7 C)   SpO2: 100% 100%   General appearance: WDWN, NAD Eyes: No scleral injection Cardiovascular: Regular rate and rhythm without murmurs, rubs, gallops. No edema or variciosities. Distal pulses normal. Pulmonary: Effort normal, non-labored breathing Musculoskeletal:     Muscle tone upper extremities: Normal    Muscle tone lower extremities: Normal    Motor exam: Upper Extremities Deltoid Bicep Tricep Grip  Right 5/5 5/5 5/5 5/5  Left 5/5 5/5 5/5 5/5   Lower Extremity IP Quad PF DF EHL  Right 5/5 5/5 5/5 5/5 5/5  Left 5/5 5/5 5/5 5/5 5/5   Neurological Mental Status:    - Patient is awake, alert, oriented to person, place, month, year, and situation    - Patient is able to give a clear and coherent history.    -  No signs of aphasia or neglect Cranial Nerves    - II: Visual Fields are full. PERRL    - III/IV/VI: EOMI without ptosis or diploplia.     - V: Facial sensation is grossly normal    - VII: Facial movement is symmetric.     - VIII: hearing is intact to voice    - X: Uvula elevates symmetrically    - XI: Shoulder shrug is symmetric.    - XII: tongue is midline without atrophy or fasciculations.  Sensory: Sensation grossly intact to LT  Results - Imaging/Labs   No results found for this or any previous visit (from the past 48 hour(s)).  Ct Head Wo Contrast  Result Date: 05/17/2018 CLINICAL DATA:   Severe headache with nausea and vomiting for the past 2 days. EXAM: CT HEAD WITHOUT CONTRAST TECHNIQUE: Contiguous axial images were obtained from the base of the skull through the vertex without intravenous contrast. COMPARISON:  Brain MR dated 04/25/2018 and head CT dated 09/08/2017. FINDINGS: Brain: Interval bilateral subdural hematomas with varying ages of blood. The majority is low density on both sides with a small amount of medium density and small amount of higher density blood, with more higher density components on the right compared to the left. Associated 3 mm of midline shift to the left. Normal sized ventricles. Mildly enlarged posterior fossa subarachnoid spaces. The cerebral hemisphere sulci are compressed. Vascular: No hyperdense vessel or unexpected calcification. Skull: No significant change in the previously demonstrated destructive lesion in the posterior left parietal bone. No new skull lesions. Sinuses/Orbits: Left maxillary sinus retention cyst. Unremarkable orbits. Other: None. IMPRESSION: 1. Interval bilateral subdural hematomas, as described above. These have chronic, subacute and acute components. 2. 3 mm of midline shift to the left. 3. 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 Critical Value/emergent results were called by telephone at the time of interpretation on 05/17/2018 at 3:08 pm to Dr. Blanchie Dessert , who verbally acknowledged these results. Electronically Signed   By: Claudie Revering M.D.   On: 05/17/2018 15:13   Impression/Plan   68 y.o. female with nausea/vomiting and headache secondary to new acute-subacute bilateral SDH. She is neurologically intact. Will admit for obs.  - Neuro checks q 2 hours - Repeat head CT tomorrow am, sooner as indicated by exam - Hopefully if head CT stable and patient N/V improved and able to tolerate po, can be discharged in the next 24-48 hours.

## 2018-05-17 NOTE — Progress Notes (Signed)
Received Patient by [place] via stretcher into room 4NP01 at 1820. Reports having headache x 2 days and started vomiting since yesterday. Denies pain; VS - 99.32F - 95 - 18 - 116/80 - 98% , RA. Glenford Peers, NP paged and made aware of pt arrival to floor. Shyam Dawson Ladora Daniel, BSN, RN

## 2018-05-17 NOTE — ED Notes (Signed)
ED Provider at bedside. 

## 2018-05-17 NOTE — ED Provider Notes (Addendum)
[place] [facility] EMERGENCY DEPARTMENT Provider Note   CSN: [id number] Arrival date & time: 05/17/18  1328    History   Chief Complaint Chief Complaint  Patient presents with  . Migraine    HPI Jamie Kirby is a 68 y.o. female.     The history is provided by the patient.  Headache  Location: band like around the eyes and back of head. Quality:  Dull Radiates to:  Does not radiate Severity currently:  7/10 Onset quality:  Gradual Duration:  2 days Timing:  Constant Progression:  Worsening Chronicity:  Recurrent Similar to prior headaches: yes   Context comment:  Started spontaneously but not thunderclap Relieved by:  Nothing Worsened by:  Nothing Ineffective treatments:  NSAIDs Associated symptoms: nausea and vomiting   Associated symptoms: no abdominal pain, no back pain, no blurred vision, no congestion, no cough, no diarrhea, no fever, no focal weakness, no loss of balance, no near-syncope, no neck pain, no neck stiffness, no numbness, no paresthesias, no photophobia, no seizures and no weakness   Associated symptoms comment:  Does have a history of a meningioma and actually has had 3 MRIs since this was discovered the most recent was 3 weeks ago and there has been no growth.  She has had migraines in the past she said approximately 6 in her entire life and this feels similar to that.  She was initially seen at urgent care and sent here to rule out intracranial hemorrhage.   Past Medical History:  Diagnosis Date  . Osteoporosis     There are no active problems to display for this patient.   Past Surgical History:  Procedure Laterality Date  . TONSILLECTOMY       OB History   No obstetric history on file.      Home Medications    Prior to Admission medications   Medication Sig Start Date End Date Taking? Authorizing Provider  ALPRAZolam Duanne Moron) 0.5 MG tablet Take 1-2 pills as needed on call to MRI. 09/18/17   [name], MD  Hypromellose (GENTEAL  MILD) 0.2 % SOLN Place 1 drop into both eyes 2 (two) times daily.    [provider]  Multiple Vitamin (MULTIVITAMIN) capsule Take 1 capsule by mouth daily. Calcium and Vitamin D    [provider]  raloxifene (EVISTA) 60 MG tablet Take 60 mg by mouth every other day.    [provider]  sertraline (ZOLOFT) 25 MG tablet Take 25 mg by mouth at bedtime.    [provider]    Family History History reviewed. No pertinent family history.  Social History Social History   Tobacco Use  . Smoking status: Never Smoker  . Smokeless tobacco: Never Used  Substance Use Topics  . Alcohol use: Not on file  . Drug use: Not on file     Allergies   Ceclor [cefaclor]   Review of Systems Review of Systems  Constitutional: Negative.  Negative for fever and unexpected weight change.  HENT: Negative.  Negative for congestion and rhinorrhea.   Eyes: Negative.  Negative for blurred vision, photophobia and redness.  Respiratory: Negative for cough.   Cardiovascular: Negative for chest pain, leg swelling and near-syncope.  Gastrointestinal: Positive for nausea and vomiting. Negative for abdominal pain and diarrhea.  Genitourinary: Negative.  Negative for dysuria, flank pain and hematuria.  Musculoskeletal: Negative for back pain, neck pain and neck stiffness.  Skin: Negative for rash.  Neurological: Positive for headaches. Negative for focal weakness, seizures,  weakness, light-headedness, numbness, paresthesias and loss of balance.  All other systems reviewed and are negative.    Physical Exam Updated Vital Signs BP 132/69 (BP Location: Right Arm)   Pulse 73   Temp 98.1 F (36.7 C) (Oral)   Resp 14   Ht 5\' 1"  (1.549 m)   Wt 46.3 kg   SpO2 100%   BMI 19.27 kg/m   Physical Exam Vitals signs and nursing note reviewed.  Constitutional:      General: She is not in acute distress.    Appearance: She is well-developed.  HENT:     Head: Normocephalic and  atraumatic.  Eyes:     Pupils: Pupils are equal, round, and reactive to light.     Funduscopic exam:    Right eye: No papilledema.        Left eye: No papilledema.     Comments: No photophobia  Neck:     Musculoskeletal: Normal range of motion and neck supple. No neck rigidity or muscular tenderness.  Cardiovascular:     Rate and Rhythm: Normal rate and regular rhythm.     Heart sounds: Normal heart sounds. No murmur. No friction rub.  Pulmonary:     Effort: Pulmonary effort is normal.     Breath sounds: Normal breath sounds. No wheezing or rales.  Abdominal:     General: Bowel sounds are normal. There is no distension.     Palpations: Abdomen is soft.     Tenderness: There is no abdominal tenderness. There is no guarding or rebound.  Musculoskeletal: Normal range of motion.        General: No tenderness.     Comments: No edema  Skin:    General: Skin is warm and dry.     Findings: No rash.  Neurological:     General: No focal deficit present.     Mental Status: She is alert and oriented to person, place, and time. Mental status is at baseline.     Cranial Nerves: No cranial nerve deficit.     Sensory: No sensory deficit.     Motor: No weakness.     Coordination: Coordination normal.     Gait: Gait normal.     Comments: photophobia  Psychiatric:        Mood and Affect: Mood normal.        Behavior: Behavior normal.        Thought Content: Thought content normal.      ED Treatments / Results  Labs (all labs ordered are listed, but only abnormal results are displayed) Labs Reviewed - No data to display  EKG None  Radiology Ct Head Wo Contrast  Result Date: 05/17/2018 CLINICAL DATA:  Severe headache with nausea and vomiting for the past 2 days. EXAM: CT HEAD WITHOUT CONTRAST TECHNIQUE: Contiguous axial images were obtained from the base of the skull through the vertex without intravenous contrast. COMPARISON:  Brain MR dated 04/25/2018 and head CT dated 09/08/2017.  FINDINGS: Brain: Interval bilateral subdural hematomas with varying ages of blood. The majority is low density on both sides with a small amount of medium density and small amount of higher density blood, with more higher density components on the right compared to the left. Associated 3 mm of midline shift to the left. Normal sized ventricles. Mildly enlarged posterior fossa subarachnoid spaces. The cerebral hemisphere sulci are compressed. Vascular: No hyperdense vessel or unexpected calcification. Skull: No significant change in the previously demonstrated destructive lesion in the posterior  left parietal bone. No new skull lesions. Sinuses/Orbits: Left maxillary sinus retention cyst. Unremarkable orbits. Other: None. IMPRESSION: 1. Interval bilateral subdural hematomas, as described above. These have chronic, subacute and acute components. 2. 3 mm of midline shift to the left. 3. 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 Critical Value/emergent results were called by telephone at the time of interpretation on 05/17/2018 at 3:08 pm to Dr. Blanchie Dessert , who verbally acknowledged these results. Electronically Signed   By: Claudie Revering M.D.   On: 05/17/2018 15:13    Procedures Procedures (including critical care time)  Medications Ordered in ED Medications  prochlorperazine (COMPAZINE) injection 10 mg (has no administration in time range)  dexamethasone (DECADRON) injection 10 mg (has no administration in time range)  diphenhydrAMINE (BENADRYL) injection 12.5 mg (has no administration in time range)     Initial Impression / Assessment and Plan / ED Course  I have reviewed the triage vital signs and the nursing notes.  Pertinent labs & imaging results that were available during my care of the patient were reviewed by me and considered in my medical decision making (see chart for details).       Pt with hx of  migraine HA without sx suggestive of SAH(sudden onset, worst of life, or deficits), infection, or cavernous vein thrombosis.  Normal neuro exam and vital signs.  Patient does have a history of lesion which has been evaluated by MRI 3 times in the last 6 months and has showed no acute growth.  We will do a CT to ensure no new acute bleed.  However patient is neurologically intact has full range of motion of her neck and normal comprehension without altered mental status.  Low suspicion that there is no bleed at this time. Will give HA cocktail and on re-eval.  3:20 PM CT is consistent with bilateral subdural hemorrhage some subacute and subacute.  This is new from prior imaging.  Patient has no history of trauma and takes no anticoagulation.  She continues to vomit here but mental status is normal.  Will talk to neurosurgery for further recommendations.  4:11 PM Given ongoing headache and vomiting, NSU will admit for observation.    Final Clinical Impressions(s) / ED Diagnoses   Final diagnoses:  Bilateral subdural hematomas [facility])    ED Discharge Orders    None       Blanchie Dessert, MD 05/17/18 1533    Blanchie Dessert, MD 05/17/18 1611

## 2018-05-17 NOTE — ED Notes (Signed)
Pt on cardiac monitor and auto VS 

## 2018-05-17 NOTE — ED Notes (Signed)
Delfino Lovett (husband) [phone]

## 2018-05-17 NOTE — ED Triage Notes (Signed)
Reports headache with nausea x 2 days.  Sent by [facility] for r/o IC bleed.

## 2018-05-17 NOTE — ED Notes (Signed)
[facility] arrived to transfer pt to [facility]

## 2018-05-18 ENCOUNTER — Encounter (HOSPITAL_COMMUNITY): Payer: Self-pay

## 2018-05-18 ENCOUNTER — Inpatient Hospital Stay (HOSPITAL_COMMUNITY): Payer: Medicare Other

## 2018-05-18 NOTE — Progress Notes (Signed)
NEUROSURGERY PROGRESS NOTE  Admitting last night for bilateral SDH. Doing well. Complains of appropriate headaches. Some occasional nausea.   Temp:  [97.8 F (36.6 C)-99.3 F (37.4 C)] 98.7 F (37.1 C) (03/14 0730) Pulse Rate:  [73-95] 80 (03/14 0730) Resp:  [11-22] 15 (03/14 0730) BP: (99-132)/(56-80) 101/56 (03/14 0730) SpO2:  [97 %-100 %] 100 % (03/14 0730) Weight:  [46.3 kg] 46.3 kg (03/13 1337)  Plan: CT head this morning showed improvement in midline shift and stable bilateral SDH. Will ambulate today.   Eleonore Chiquito, NP 05/18/2018 10:30 AM

## 2018-05-18 NOTE — Social Work (Signed)
CSW acknowledging consult for SNF placement. Will follow for therapy recommendations.   Furman Trentman, MSW, LCSWA [place] Clinical Social Work [phone]   

## 2018-05-19 MED ORDER — ATORVASTATIN CALCIUM 10 MG PO TABS
20.0000 mg | ORAL_TABLET | Freq: Every day | ORAL | Status: DC
  Administered 2018-05-19: 20 mg via ORAL
  Filled 2018-05-19: qty 2

## 2018-05-19 NOTE — Progress Notes (Signed)
NEUROSURGERY PROGRESS NOTE  Doing well. Complains of persistent headaches No NV, Ambulating well. No acute events overnight   Temp:  [98.8 F (37.1 C)-99.4 F (37.4 C)] 98.8 F (37.1 C) (03/15 0742) Pulse Rate:  [63-94] 70 (03/15 0742) Resp:  [14-21] 20 (03/15 0742) BP: (89-129)/(54-76) 101/58 (03/15 0742) SpO2:  [94 %-100 %] 94 % (03/15 0742)  Plan: CT head stable with a  decrease in midline shift. Will start Lipitor as there are studies that show this to decrease the size of SDH over time. Continue current management. May advance diet as tolerated  Eleonore Chiquito, NP 05/19/2018 9:56 AM

## 2018-05-20 NOTE — Care Management Important Message (Signed)
Important Message  Patient Details  Name: Jamie Kirby MRN: [id number] Date of Birth: 11/23/1950   Medicare Important Message Given:  Yes    Jamie Kirby, Jamie Kirby

## 2018-05-20 NOTE — Discharge Summary (Signed)
  Physician Discharge Summary  Patient ID: Jamie Kirby MRN: [id number] DOB/AGE: [birth year] 68 y.o.  Admit date: 05/17/2018 Discharge date: 05/20/2018  Admission Diagnoses:  SDH  Discharge Diagnoses:  Same Active Problems:   Subdural hematoma (H[facility])   SDH (subdural hematoma) [facility])  Discharged Condition: Stable  Hospital Course:  Jamie Kirby is a 68 y.o. female Who presented to [facility] due to a 2 day history of  not intractable nausea/vomiting and headache on 05/17/2018. She was found to have bilateral subdural hematomas with minimal midline shift.  She was subsequently transferred to [facility] & admitted for observation.    She underwent a repeat head CT the following day on 05/18/2018 which showed stable subdural hematomas with resolution of previously seen midline shift.  Although she continued to have a mild headache it was much improved when compared to pre-admission, in addition to resolution of N/V. Her nausea. She had an on complicated hospital co urse  And remained neurologically intact. On hospital day 3., she was deemed medically stable for discharge as her nausea and vomiting  Had resolved in her headache was much improved.  She will follow-up in 4 weeks for repeat head CT to continue to monitor.  Treatments: Surgery - none  Discharge Exam: Blood pressure (!) 97/43, pulse 72, temperature 98.2 F (36.8 C), temperature source Oral, resp. rate 20, height 5\' 1"  (1.549 m), weight 46.3 kg, SpO2 96 %. Awake, alert, oriented Speech fluent, appropriate CN grossly intact 5/5 BUE/BLE Wound c/d/i  Disposition: Discharge disposition: 01-Home or Self Care       Discharge Instructions    Call MD for:  difficulty breathing, headache or visual disturbances   Complete by:  As directed    Call MD for:  persistant dizziness or light-headedness   Complete by:  As directed    Call MD for:  severe uncontrolled pain   Complete by:  As directed    Diet general   Complete by:   As directed    Increase activity slowly   Complete by:  As directed      Allergies as of 05/20/2018      Reactions   Ceclor [cefaclor] Rash      Medication List    TAKE these medications   ALPRAZolam 0.5 MG tablet Commonly known as:  XANAX Take 1-2 pills as needed on call to MRI. What changed:    how much to take  how to take this  when to take this  reasons to take this  additional instructions   GenTeal Mild 0.2 % Soln Generic drug:  Hypromellose Place 1 drop into both eyes 2 (two) times daily.   multivitamin capsule Take 1 capsule by mouth daily.   raloxifene 60 MG tablet Commonly known as:  EVISTA Take 60 mg by mouth every other day.   sertraline 25 MG tablet Commonly known as:  ZOLOFT Take 25 mg by mouth at bedtime.      Follow-up Information    Consuella Lose, MD. Schedule an appointment as soon as possible for a visit in 4 week(s).   Specialty:  Neurosurgery Contact information: [address] [place] [address] [phone]           Signed: Traci Sermon 05/20/2018, 11:35 AM

## 2018-05-20 NOTE — Care Management Important Message (Signed)
Important Message  Patient Details  Name: Jamie Kirby MRN: [id number] Date of Birth: 04-08-1950   Medicare Important Message Given:  Yes    Jamie Kirby 05/20/2018, 4:46 PM

## 2018-05-29 ENCOUNTER — Other Ambulatory Visit: Payer: Self-pay | Admitting: Neurosurgery

## 2018-05-29 DIAGNOSIS — S065X9A Traumatic subdural hemorrhage with loss of consciousness of unspecified duration, initial encounter: Secondary | ICD-10-CM

## 2018-05-29 DIAGNOSIS — S065XAA Traumatic subdural hemorrhage with loss of consciousness status unknown, initial encounter: Secondary | ICD-10-CM

## 2018-06-06 ENCOUNTER — Other Ambulatory Visit: Payer: Self-pay | Admitting: Neurosurgery

## 2018-06-06 DIAGNOSIS — S065X9A Traumatic subdural hemorrhage with loss of consciousness of unspecified duration, initial encounter: Secondary | ICD-10-CM

## 2018-06-06 DIAGNOSIS — S065XAA Traumatic subdural hemorrhage with loss of consciousness status unknown, initial encounter: Secondary | ICD-10-CM

## 2018-06-11 ENCOUNTER — Other Ambulatory Visit: Payer: Self-pay

## 2018-06-11 ENCOUNTER — Ambulatory Visit
Admission: RE | Admit: 2018-06-11 | Discharge: 2018-06-11 | Disposition: A | Discharge status: Home / Self Care | Payer: Medicare Other | Source: Ambulatory Visit | Attending: Neurosurgery | Admitting: Neurosurgery

## 2018-06-11 DIAGNOSIS — R51 Headache: Secondary | ICD-10-CM | POA: Diagnosis not present

## 2018-06-11 DIAGNOSIS — S065XAA Traumatic subdural hemorrhage with loss of consciousness status unknown, initial encounter: Secondary | ICD-10-CM

## 2018-06-11 DIAGNOSIS — S065X9A Traumatic subdural hemorrhage with loss of consciousness of unspecified duration, initial encounter: Secondary | ICD-10-CM

## 2018-06-19 ENCOUNTER — Other Ambulatory Visit: Payer: Self-pay | Admitting: Neurosurgery

## 2018-06-19 DIAGNOSIS — S065XAA Traumatic subdural hemorrhage with loss of consciousness status unknown, initial encounter: Secondary | ICD-10-CM

## 2018-06-19 DIAGNOSIS — S065X9A Traumatic subdural hemorrhage with loss of consciousness of unspecified duration, initial encounter: Secondary | ICD-10-CM

## 2018-07-05 ENCOUNTER — Ambulatory Visit
Admission: RE | Admit: 2018-07-05 | Discharge: 2018-07-05 | Disposition: A | Discharge status: Home / Self Care | Payer: Medicare Other | Source: Ambulatory Visit | Attending: Neurosurgery | Admitting: Neurosurgery

## 2018-07-05 ENCOUNTER — Other Ambulatory Visit: Payer: Self-pay

## 2018-07-05 DIAGNOSIS — S065XAA Traumatic subdural hemorrhage with loss of consciousness status unknown, initial encounter: Secondary | ICD-10-CM

## 2018-07-05 DIAGNOSIS — S065X9S Traumatic subdural hemorrhage with loss of consciousness of unspecified duration, sequela: Secondary | ICD-10-CM | POA: Diagnosis not present

## 2018-07-05 DIAGNOSIS — S065X9A Traumatic subdural hemorrhage with loss of consciousness of unspecified duration, initial encounter: Secondary | ICD-10-CM

## 2018-07-08 ENCOUNTER — Other Ambulatory Visit: Payer: Medicare Other

## 2018-07-08 ENCOUNTER — Other Ambulatory Visit: Payer: Self-pay | Admitting: Neurosurgery

## 2018-07-08 ENCOUNTER — Encounter (HOSPITAL_COMMUNITY): Payer: Self-pay | Admitting: *Deleted

## 2018-07-08 DIAGNOSIS — S065X9A Traumatic subdural hemorrhage with loss of consciousness of unspecified duration, initial encounter: Secondary | ICD-10-CM | POA: Diagnosis not present

## 2018-07-08 NOTE — Progress Notes (Addendum)
Jamie Kirby denies chest painor shortness of breath. Patient denies that she or her family has experienced any of the following: Cough Fever >100.4 Runny Nose Sore Throat Difficulty breathing/ shortness of breath Travel in past 14 days- none  PCP- Dr Via Sadie Haber.

## 2018-07-08 NOTE — Anesthesia Preprocedure Evaluation (Addendum)
Anesthesia Evaluation  Patient identified by MRN, date of birth, ID band Patient awake    Reviewed: Allergy & Precautions, NPO status , Patient's Chart, lab work & pertinent test results  Airway Mallampati: II  TM Distance: >3 FB Neck ROM: Full    Dental  (+) Dental Advisory Given, Teeth Intact   Pulmonary neg pulmonary ROS,    Pulmonary exam normal breath sounds clear to auscultation       Cardiovascular negative cardio ROS Normal cardiovascular exam Rhythm:Regular Rate:Normal     Neuro/Psych  Headaches, PSYCHIATRIC DISORDERS Anxiety Depression    GI/Hepatic negative GI ROS, Neg liver ROS,   Endo/Other  negative endocrine ROS  Renal/GU negative Renal ROS     Musculoskeletal negative musculoskeletal ROS (+)   Abdominal   Peds  Hematology negative hematology ROS (+)   Anesthesia Other Findings Day of surgery medications reviewed with the patient.  Reproductive/Obstetrics negative OB ROS                           Anesthesia Physical Anesthesia Plan  ASA: III  Anesthesia Plan: General   Post-op Pain Management:    Induction: Intravenous and Rapid sequence  PONV Risk Score and Plan: 3 and Treatment may vary due to age or medical condition, Ondansetron and Dexamethasone  Airway Management Planned: Oral ETT  Additional Equipment: Arterial line  Intra-op Plan:   Post-operative Plan: Extubation in OR  Informed Consent: I have reviewed the patients History and Physical, chart, labs and discussed the procedure including the risks, benefits and alternatives for the proposed anesthesia with the patient or authorized representative who has indicated his/her understanding and acceptance.     Dental advisory given  Plan Discussed with: CRNA  Anesthesia Plan Comments:         Anesthesia Quick Evaluation

## 2018-07-09 ENCOUNTER — Other Ambulatory Visit: Payer: Self-pay

## 2018-07-09 ENCOUNTER — Encounter (HOSPITAL_COMMUNITY)
Admission: RE | Disposition: A | Discharge status: Home / Self Care | Payer: Self-pay | Source: Home / Self Care | Attending: Neurosurgery

## 2018-07-09 ENCOUNTER — Inpatient Hospital Stay (HOSPITAL_COMMUNITY): Payer: Medicare Other | Admitting: Anesthesiology

## 2018-07-09 ENCOUNTER — Encounter (HOSPITAL_COMMUNITY): Payer: Self-pay | Admitting: Certified Registered Nurse Anesthetist

## 2018-07-09 ENCOUNTER — Inpatient Hospital Stay (HOSPITAL_COMMUNITY)
Admission: RE | Admit: 2018-07-09 | Discharge: 2018-07-12 | DRG: 026 | Disposition: A | Discharge status: Home / Self Care | Payer: Medicare Other | Attending: Neurosurgery | Admitting: Neurosurgery

## 2018-07-09 DIAGNOSIS — F419 Anxiety disorder, unspecified: Secondary | ICD-10-CM | POA: Diagnosis present

## 2018-07-09 DIAGNOSIS — Z79899 Other long term (current) drug therapy: Secondary | ICD-10-CM

## 2018-07-09 DIAGNOSIS — F329 Major depressive disorder, single episode, unspecified: Secondary | ICD-10-CM | POA: Diagnosis present

## 2018-07-09 DIAGNOSIS — M810 Age-related osteoporosis without current pathological fracture: Secondary | ICD-10-CM | POA: Diagnosis present

## 2018-07-09 DIAGNOSIS — I6203 Nontraumatic chronic subdural hemorrhage: Secondary | ICD-10-CM | POA: Diagnosis present

## 2018-07-09 DIAGNOSIS — I6200 Nontraumatic subdural hemorrhage, unspecified: Secondary | ICD-10-CM | POA: Diagnosis not present

## 2018-07-09 DIAGNOSIS — F418 Other specified anxiety disorders: Secondary | ICD-10-CM | POA: Diagnosis not present

## 2018-07-09 DIAGNOSIS — Z881 Allergy status to other antibiotic agents status: Secondary | ICD-10-CM | POA: Diagnosis not present

## 2018-07-09 DIAGNOSIS — S065X9A Traumatic subdural hemorrhage with loss of consciousness of unspecified duration, initial encounter: Secondary | ICD-10-CM | POA: Diagnosis present

## 2018-07-09 DIAGNOSIS — S065XAA Traumatic subdural hemorrhage with loss of consciousness status unknown, initial encounter: Secondary | ICD-10-CM | POA: Diagnosis present

## 2018-07-09 DIAGNOSIS — D62 Acute posthemorrhagic anemia: Secondary | ICD-10-CM | POA: Diagnosis not present

## 2018-07-09 HISTORY — DX: Headache, unspecified: R51.9

## 2018-07-09 HISTORY — DX: Headache: R51

## 2018-07-09 HISTORY — DX: Major depressive disorder, single episode, unspecified: F32.9

## 2018-07-09 HISTORY — DX: Depression, unspecified: F32.A

## 2018-07-09 HISTORY — PX: CRANIOTOMY: SHX93

## 2018-07-09 HISTORY — DX: Traumatic subdural hemorrhage with loss of consciousness status unknown, initial encounter: S06.5XAA

## 2018-07-09 HISTORY — DX: Disorder of brain, unspecified: G93.9

## 2018-07-09 HISTORY — DX: Anxiety disorder, unspecified: F41.9

## 2018-07-09 HISTORY — DX: Traumatic subdural hemorrhage with loss of consciousness of unspecified duration, initial encounter: S06.5X9A

## 2018-07-09 LAB — CBC
HCT: 40.2 % (ref 36.0–46.0)
Hemoglobin: 13 g/dL (ref 12.0–15.0)
MCH: 31.6 pg (ref 26.0–34.0)
MCHC: 32.3 g/dL (ref 30.0–36.0)
MCV: 97.6 fL (ref 80.0–100.0)
Platelets: 297 10*3/uL (ref 150–400)
RBC: 4.12 MIL/uL (ref 3.87–5.11)
RDW: 11.9 % (ref 11.5–15.5)
WBC: 8.6 10*3/uL (ref 4.0–10.5)
nRBC: 0 % (ref 0.0–0.2)

## 2018-07-09 LAB — BASIC METABOLIC PANEL
Anion gap: 13 (ref 5–15)
BUN: 20 mg/dL (ref 8–23)
CO2: 25 mmol/L (ref 22–32)
Calcium: 9.9 mg/dL (ref 8.9–10.3)
Chloride: 102 mmol/L (ref 98–111)
Creatinine, Ser: 0.85 mg/dL (ref 0.44–1.00)
GFR calc Af Amer: >60 mL/min (ref >60)
GFR calc non Af Amer: >60 mL/min (ref >60)
Glucose, Bld: 99 mg/dL (ref 70–99)
Potassium: 3.9 mmol/L (ref 3.5–5.1)
Sodium: 140 mmol/L (ref 135–145)

## 2018-07-09 LAB — TYPE AND SCREEN
ABO/RH(D): A POS
Antibody Screen: NEGATIVE

## 2018-07-09 LAB — ABO/RH: ABO/RH(D): A POS

## 2018-07-09 SURGERY — CRANIOTOMY HEMATOMA EVACUATION SUBDURAL
Anesthesia: General | Site: Head | Laterality: Left

## 2018-07-09 MED ORDER — SODIUM CHLORIDE 0.9 % IV SOLN: INTRAVENOUS | Status: DC

## 2018-07-09 MED ORDER — ONDANSETRON HCL 4 MG/2ML IJ SOLN
INTRAMUSCULAR | Status: DC | PRN
  Administered 2018-07-09: 4 mg via INTRAVENOUS

## 2018-07-09 MED ORDER — HYDROMORPHONE HCL 1 MG/ML IJ SOLN
0.5000 mg | INTRAMUSCULAR | Status: DC | PRN
  Administered 2018-07-09: 1 mg via INTRAVENOUS
  Administered 2018-07-11: 0.5 mg via INTRAVENOUS
  Filled 2018-07-09: qty 1

## 2018-07-09 MED ORDER — SERTRALINE HCL 25 MG PO TABS
25.0000 mg | ORAL_TABLET | Freq: Every day | ORAL | Status: DC
  Administered 2018-07-10 – 2018-07-12 (×3): 25 mg via ORAL
  Filled 2018-07-09 (×3): qty 1

## 2018-07-09 MED ORDER — ROCURONIUM BROMIDE 10 MG/ML (PF) SYRINGE
PREFILLED_SYRINGE | INTRAVENOUS | Status: DC | PRN
  Administered 2018-07-09: 50 mg via INTRAVENOUS

## 2018-07-09 MED ORDER — BUPIVACAINE HCL (PF) 0.5 % IJ SOLN
INTRAMUSCULAR | Status: DC | PRN
  Administered 2018-07-09: 5 mL

## 2018-07-09 MED ORDER — METHYLPREDNISOLONE 4 MG PO TBPK: 4.0000 mg | ORAL_TABLET | Freq: Every day | ORAL | Status: DC

## 2018-07-09 MED ORDER — BUPIVACAINE HCL (PF) 0.5 % IJ SOLN
INTRAMUSCULAR | Status: AC
  Filled 2018-07-09: qty 30

## 2018-07-09 MED ORDER — FLEET ENEMA 7-19 GM/118ML RE ENEM: 1.0000 | ENEMA | Freq: Once | RECTAL | Status: DC | PRN

## 2018-07-09 MED ORDER — THROMBIN 20000 UNITS EX KIT
PACK | CUTANEOUS | Status: AC
  Filled 2018-07-09: qty 1

## 2018-07-09 MED ORDER — HYDROMORPHONE HCL 1 MG/ML IJ SOLN
INTRAMUSCULAR | Status: AC
  Filled 2018-07-09: qty 1

## 2018-07-09 MED ORDER — METHYLPREDNISOLONE 4 MG PO TABS
4.0000 mg | ORAL_TABLET | Freq: Every day | ORAL | Status: AC
  Administered 2018-07-12: 4 mg via ORAL
  Filled 2018-07-09 (×2): qty 1

## 2018-07-09 MED ORDER — DEXAMETHASONE SODIUM PHOSPHATE 10 MG/ML IJ SOLN
INTRAMUSCULAR | Status: AC
  Filled 2018-07-09: qty 1

## 2018-07-09 MED ORDER — SODIUM CHLORIDE 0.9 % IV SOLN
0.0500 ug/kg/min | INTRAVENOUS | Status: DC
  Administered 2018-07-09: .2 ug/kg/min via INTRAVENOUS
  Filled 2018-07-09 (×2): qty 5000

## 2018-07-09 MED ORDER — DEXAMETHASONE SODIUM PHOSPHATE 10 MG/ML IJ SOLN
INTRAMUSCULAR | Status: DC | PRN
  Administered 2018-07-09: 10 mg via INTRAVENOUS

## 2018-07-09 MED ORDER — OXYCODONE HCL 5 MG PO TABS: 5.0000 mg | ORAL_TABLET | ORAL | Status: DC | PRN

## 2018-07-09 MED ORDER — CARBOXYMETHYLCELLUL-GLYCERIN 0.5-0.9 % OP SOLN: 1.0000 [drp] | Freq: Every day | OPHTHALMIC | Status: DC | PRN

## 2018-07-09 MED ORDER — HYDROCODONE-ACETAMINOPHEN 5-325 MG PO TABS
1.0000 | ORAL_TABLET | ORAL | Status: DC | PRN
  Administered 2018-07-09 – 2018-07-10 (×2): 1 via ORAL
  Filled 2018-07-09 (×2): qty 1

## 2018-07-09 MED ORDER — SODIUM CHLORIDE 0.9 % IV SOLN
INTRAVENOUS | Status: DC
  Administered 2018-07-09: 07:00:00 via INTRAVENOUS

## 2018-07-09 MED ORDER — EPHEDRINE SULFATE-NACL 50-0.9 MG/10ML-% IV SOSY
PREFILLED_SYRINGE | INTRAVENOUS | Status: DC | PRN
  Administered 2018-07-09: 5 mg via INTRAVENOUS

## 2018-07-09 MED ORDER — METHYLPREDNISOLONE 4 MG PO TABS
4.0000 mg | ORAL_TABLET | Freq: Once | ORAL | Status: AC
  Administered 2018-07-09: 4 mg via ORAL
  Filled 2018-07-09 (×2): qty 1

## 2018-07-09 MED ORDER — THROMBIN 5000 UNITS EX SOLR
CUTANEOUS | Status: AC
  Filled 2018-07-09: qty 5000

## 2018-07-09 MED ORDER — SODIUM CHLORIDE 0.9 % IV SOLN
INTRAVENOUS | Status: DC | PRN
  Administered 2018-07-09: 07:00:00 via INTRAVENOUS

## 2018-07-09 MED ORDER — ONDANSETRON HCL 4 MG/2ML IJ SOLN
INTRAMUSCULAR | Status: AC
  Filled 2018-07-09: qty 2

## 2018-07-09 MED ORDER — SODIUM CHLORIDE 0.9 % IV SOLN
INTRAVENOUS | Status: DC | PRN
  Administered 2018-07-09: 500 mL

## 2018-07-09 MED ORDER — HEMOSTATIC AGENTS (NO CHARGE) OPTIME
TOPICAL | Status: DC | PRN
  Administered 2018-07-09: 1 via TOPICAL

## 2018-07-09 MED ORDER — ACETAMINOPHEN 325 MG PO TABS
650.0000 mg | ORAL_TABLET | ORAL | Status: DC | PRN
  Administered 2018-07-10 – 2018-07-11 (×3): 650 mg via ORAL
  Filled 2018-07-09 (×3): qty 2

## 2018-07-09 MED ORDER — SENNOSIDES-DOCUSATE SODIUM 8.6-50 MG PO TABS: 1.0000 | ORAL_TABLET | Freq: Every evening | ORAL | Status: DC | PRN

## 2018-07-09 MED ORDER — REMIFENTANIL HCL 1 MG IV SOLR: INTRAVENOUS | Status: DC | PRN

## 2018-07-09 MED ORDER — 0.9 % SODIUM CHLORIDE (POUR BTL) OPTIME
TOPICAL | Status: DC | PRN
  Administered 2018-07-09 (×2): 1000 mL

## 2018-07-09 MED ORDER — METHYLPREDNISOLONE 4 MG PO TABS
4.0000 mg | ORAL_TABLET | Freq: Three times a day (TID) | ORAL | Status: AC
  Administered 2018-07-10 (×3): 4 mg via ORAL
  Filled 2018-07-09 (×3): qty 1

## 2018-07-09 MED ORDER — SODIUM CHLORIDE 0.9 % IV SOLN
INTRAVENOUS | Status: DC
  Administered 2018-07-09 – 2018-07-10 (×2): via INTRAVENOUS

## 2018-07-09 MED ORDER — CHLORHEXIDINE GLUCONATE CLOTH 2 % EX PADS: 6.0000 | MEDICATED_PAD | Freq: Once | CUTANEOUS | Status: DC

## 2018-07-09 MED ORDER — NALOXONE HCL 0.4 MG/ML IJ SOLN: 0.0800 mg | INTRAMUSCULAR | Status: DC | PRN

## 2018-07-09 MED ORDER — POLYVINYL ALCOHOL 1.4 % OP SOLN
1.0000 [drp] | OPHTHALMIC | Status: DC | PRN
  Filled 2018-07-09: qty 15

## 2018-07-09 MED ORDER — SUCCINYLCHOLINE CHLORIDE 200 MG/10ML IV SOSY
PREFILLED_SYRINGE | INTRAVENOUS | Status: AC
  Filled 2018-07-09: qty 10

## 2018-07-09 MED ORDER — LIDOCAINE-EPINEPHRINE 1 %-1:100000 IJ SOLN
INTRAMUSCULAR | Status: DC | PRN
  Administered 2018-07-09: 5 mL

## 2018-07-09 MED ORDER — LIDOCAINE-EPINEPHRINE 1 %-1:100000 IJ SOLN
INTRAMUSCULAR | Status: AC
  Filled 2018-07-09: qty 1

## 2018-07-09 MED ORDER — PROPOFOL 10 MG/ML IV BOLUS
INTRAVENOUS | Status: AC
  Filled 2018-07-09: qty 20

## 2018-07-09 MED ORDER — DOCUSATE SODIUM 100 MG PO CAPS
100.0000 mg | ORAL_CAPSULE | Freq: Two times a day (BID) | ORAL | Status: DC
  Administered 2018-07-09 – 2018-07-12 (×5): 100 mg via ORAL
  Filled 2018-07-09 (×6): qty 1

## 2018-07-09 MED ORDER — MULTIVITAMINS PO CAPS: 1.0000 | ORAL_CAPSULE | Freq: Every day | ORAL | Status: DC

## 2018-07-09 MED ORDER — ADULT MULTIVITAMIN W/MINERALS CH
1.0000 | ORAL_TABLET | Freq: Every day | ORAL | Status: DC
  Administered 2018-07-10 – 2018-07-12 (×3): 1 via ORAL
  Filled 2018-07-09 (×3): qty 1

## 2018-07-09 MED ORDER — FENTANYL CITRATE (PF) 100 MCG/2ML IJ SOLN
INTRAMUSCULAR | Status: AC
  Filled 2018-07-09: qty 2

## 2018-07-09 MED ORDER — LABETALOL HCL 5 MG/ML IV SOLN: 10.0000 mg | INTRAVENOUS | Status: DC | PRN

## 2018-07-09 MED ORDER — LIDOCAINE 2% (20 MG/ML) 5 ML SYRINGE
INTRAMUSCULAR | Status: DC | PRN
  Administered 2018-07-09: 100 mg via INTRAVENOUS

## 2018-07-09 MED ORDER — SODIUM CHLORIDE 0.9 % IV SOLN
INTRAVENOUS | Status: DC | PRN
  Administered 2018-07-09: 20 ug/min via INTRAVENOUS

## 2018-07-09 MED ORDER — ONDANSETRON HCL 4 MG/2ML IJ SOLN
4.0000 mg | INTRAMUSCULAR | Status: DC | PRN
  Administered 2018-07-09: 4 mg via INTRAVENOUS

## 2018-07-09 MED ORDER — FENTANYL CITRATE (PF) 100 MCG/2ML IJ SOLN
25.0000 ug | INTRAMUSCULAR | Status: DC | PRN
  Administered 2018-07-09 (×3): 50 ug via INTRAVENOUS

## 2018-07-09 MED ORDER — LIDOCAINE 2% (20 MG/ML) 5 ML SYRINGE
INTRAMUSCULAR | Status: AC
  Filled 2018-07-09: qty 5

## 2018-07-09 MED ORDER — ROCURONIUM BROMIDE 10 MG/ML (PF) SYRINGE
PREFILLED_SYRINGE | INTRAVENOUS | Status: AC
  Filled 2018-07-09: qty 10

## 2018-07-09 MED ORDER — PANTOPRAZOLE SODIUM 40 MG IV SOLR
40.0000 mg | Freq: Every day | INTRAVENOUS | Status: DC
  Administered 2018-07-09 – 2018-07-11 (×3): 40 mg via INTRAVENOUS
  Filled 2018-07-09 (×4): qty 40

## 2018-07-09 MED ORDER — PROMETHAZINE HCL 25 MG/ML IJ SOLN: 6.2500 mg | INTRAMUSCULAR | Status: DC | PRN

## 2018-07-09 MED ORDER — ACETAMINOPHEN 650 MG RE SUPP: 650.0000 mg | RECTAL | Status: DC | PRN

## 2018-07-09 MED ORDER — METHYLPREDNISOLONE 4 MG PO TABS
4.0000 mg | ORAL_TABLET | Freq: Two times a day (BID) | ORAL | Status: AC
  Administered 2018-07-11 (×2): 4 mg via ORAL
  Filled 2018-07-09 (×2): qty 1

## 2018-07-09 MED ORDER — RALOXIFENE HCL 60 MG PO TABS
60.0000 mg | ORAL_TABLET | ORAL | Status: DC
  Administered 2018-07-10 – 2018-07-12 (×2): 60 mg via ORAL
  Filled 2018-07-09 (×2): qty 1

## 2018-07-09 MED ORDER — HYDRALAZINE HCL 20 MG/ML IJ SOLN: 5.0000 mg | INTRAMUSCULAR | Status: DC | PRN

## 2018-07-09 MED ORDER — SUCCINYLCHOLINE CHLORIDE 200 MG/10ML IV SOSY
PREFILLED_SYRINGE | INTRAVENOUS | Status: DC | PRN
  Administered 2018-07-09: 100 mg via INTRAVENOUS

## 2018-07-09 MED ORDER — PROMETHAZINE HCL 12.5 MG PO TABS
12.5000 mg | ORAL_TABLET | ORAL | Status: DC | PRN
  Filled 2018-07-09: qty 2

## 2018-07-09 MED ORDER — SUGAMMADEX SODIUM 200 MG/2ML IV SOLN
INTRAVENOUS | Status: DC | PRN
  Administered 2018-07-09: 100 mg via INTRAVENOUS

## 2018-07-09 MED ORDER — ONDANSETRON HCL 4 MG PO TABS: 4.0000 mg | ORAL_TABLET | ORAL | Status: DC | PRN

## 2018-07-09 MED ORDER — THROMBIN 20000 UNITS EX SOLR
CUTANEOUS | Status: DC | PRN
  Administered 2018-07-09: 20 mL via TOPICAL

## 2018-07-09 MED ORDER — VANCOMYCIN HCL IN DEXTROSE 750-5 MG/150ML-% IV SOLN
750.0000 mg | Freq: Once | INTRAVENOUS | Status: AC
  Administered 2018-07-10: 750 mg via INTRAVENOUS
  Filled 2018-07-09 (×2): qty 150

## 2018-07-09 MED ORDER — PROPOFOL 10 MG/ML IV BOLUS
INTRAVENOUS | Status: DC | PRN
  Administered 2018-07-09: 120 mg via INTRAVENOUS

## 2018-07-09 MED ORDER — VANCOMYCIN HCL IN DEXTROSE 1-5 GM/200ML-% IV SOLN
1000.0000 mg | INTRAVENOUS | Status: AC
  Administered 2018-07-09: 1000 mg via INTRAVENOUS
  Filled 2018-07-09: qty 200

## 2018-07-09 MED ORDER — BACITRACIN ZINC 500 UNIT/GM EX OINT
TOPICAL_OINTMENT | CUTANEOUS | Status: AC
  Filled 2018-07-09: qty 28.35

## 2018-07-09 MED ORDER — MEPERIDINE HCL 25 MG/ML IJ SOLN: 6.2500 mg | INTRAMUSCULAR | Status: DC | PRN

## 2018-07-09 MED ORDER — LEVETIRACETAM IN NACL 500 MG/100ML IV SOLN
500.0000 mg | Freq: Two times a day (BID) | INTRAVENOUS | Status: DC
  Administered 2018-07-09 – 2018-07-12 (×6): 500 mg via INTRAVENOUS
  Filled 2018-07-09 (×6): qty 100

## 2018-07-09 MED ORDER — SODIUM CHLORIDE 0.9 % IV SOLN
INTRAVENOUS | Status: DC | PRN
  Administered 2018-07-09: 08:00:00 via INTRAVENOUS

## 2018-07-09 MED ORDER — BACITRACIN ZINC 500 UNIT/GM EX OINT
TOPICAL_OINTMENT | CUTANEOUS | Status: DC | PRN
  Administered 2018-07-09: 1 via TOPICAL

## 2018-07-09 MED ORDER — THROMBIN 5000 UNITS EX SOLR
OROMUCOSAL | Status: DC | PRN
  Administered 2018-07-09: 5 mL via TOPICAL

## 2018-07-09 MED ORDER — BISACODYL 5 MG PO TBEC: 5.0000 mg | DELAYED_RELEASE_TABLET | Freq: Every day | ORAL | Status: DC | PRN

## 2018-07-09 MED ORDER — FENTANYL CITRATE (PF) 250 MCG/5ML IJ SOLN
INTRAMUSCULAR | Status: AC
  Filled 2018-07-09: qty 5

## 2018-07-09 SURGICAL SUPPLY — 74 items
BATTERY IQ STERILE (MISCELLANEOUS) ×2 IMPLANT
BENZOIN TINCTURE PRP APPL 2/3 (GAUZE/BANDAGES/DRESSINGS) IMPLANT
BLADE CLIPPER SURG (BLADE) ×2 IMPLANT
BNDG GAUZE ELAST 4 BULKY (GAUZE/BANDAGES/DRESSINGS) IMPLANT
BUR ACORN 6.0 PRECISION (BURR) ×2 IMPLANT
BUR MATCHSTICK NEURO 3.0 LAGG (BURR) ×2 IMPLANT
BUR SPIRAL ROUTER 2.3 (BUR) IMPLANT
CANISTER SUCT 3000ML PPV (MISCELLANEOUS) ×2 IMPLANT
CARTRIDGE OIL MAESTRO DRILL (MISCELLANEOUS) ×1 IMPLANT
CATH ROBINSON RED A/P 14FR (CATHETERS) ×2 IMPLANT
CLIP VESOCCLUDE MED 6/CT (CLIP) IMPLANT
COVER WAND RF STERILE (DRAPES) ×2 IMPLANT
DIFFUSER DRILL AIR PNEUMATIC (MISCELLANEOUS) ×2 IMPLANT
DRAIN SNY WOU 7FLT (WOUND CARE) ×2 IMPLANT
DRAPE NEUROLOGICAL W/INCISE (DRAPES) ×2 IMPLANT
DRAPE SURG 17X23 STRL (DRAPES) IMPLANT
DRAPE WARM FLUID 44X44 (DRAPE) ×2 IMPLANT
DRSG TELFA 3X8 NADH (GAUZE/BANDAGES/DRESSINGS) ×2 IMPLANT
DURAPREP 6ML APPLICATOR 50/CS (WOUND CARE) ×2 IMPLANT
ELECT REM PT RETURN 9FT ADLT (ELECTROSURGICAL) ×2
ELECTRODE REM PT RTRN 9FT ADLT (ELECTROSURGICAL) ×1 IMPLANT
EVACUATOR 1/8 PVC DRAIN (DRAIN) IMPLANT
EVACUATOR SILICONE 100CC (DRAIN) ×2 IMPLANT
GAUZE 4X4 16PLY RFD (DISPOSABLE) IMPLANT
GAUZE SPONGE 4X4 12PLY STRL (GAUZE/BANDAGES/DRESSINGS) ×2 IMPLANT
GLOVE BIO SURGEON STRL SZ7.5 (GLOVE) ×2 IMPLANT
GLOVE BIOGEL PI IND STRL 7.0 (GLOVE) ×2 IMPLANT
GLOVE BIOGEL PI IND STRL 7.5 (GLOVE) ×4 IMPLANT
GLOVE BIOGEL PI INDICATOR 7.0 (GLOVE) ×2
GLOVE BIOGEL PI INDICATOR 7.5 (GLOVE) ×4
GLOVE ECLIPSE 7.0 STRL STRAW (GLOVE) ×4 IMPLANT
GLOVE EXAM NITRILE XL STR (GLOVE) IMPLANT
GLOVE SURG SS PI 7.5 STRL IVOR (GLOVE) ×6 IMPLANT
GOWN STRL REUS W/ TWL LRG LVL3 (GOWN DISPOSABLE) ×4 IMPLANT
GOWN STRL REUS W/ TWL XL LVL3 (GOWN DISPOSABLE) IMPLANT
GOWN STRL REUS W/TWL 2XL LVL3 (GOWN DISPOSABLE) IMPLANT
GOWN STRL REUS W/TWL LRG LVL3 (GOWN DISPOSABLE) ×4
GOWN STRL REUS W/TWL XL LVL3 (GOWN DISPOSABLE)
HEMOSTAT POWDER KIT SURGIFOAM (HEMOSTASIS) ×2 IMPLANT
HEMOSTAT SURGICEL 2X14 (HEMOSTASIS) IMPLANT
KIT BASIN OR (CUSTOM PROCEDURE TRAY) ×2 IMPLANT
KIT TURNOVER KIT B (KITS) ×2 IMPLANT
NEEDLE HYPO 22GX1.5 SAFETY (NEEDLE) ×2 IMPLANT
NS IRRIG 1000ML POUR BTL (IV SOLUTION) ×2 IMPLANT
OIL CARTRIDGE MAESTRO DRILL (MISCELLANEOUS) ×2
PACK CRANIOTOMY CUSTOM (CUSTOM PROCEDURE TRAY) ×2 IMPLANT
PATTIES SURGICAL .5 X.5 (GAUZE/BANDAGES/DRESSINGS) IMPLANT
PATTIES SURGICAL .5 X3 (DISPOSABLE) IMPLANT
PATTIES SURGICAL 1X1 (DISPOSABLE) IMPLANT
PLATE 1.5  2HOLE LNG NEURO (Plate) ×3 IMPLANT
PLATE 1.5 2HOLE LNG NEURO (Plate) ×3 IMPLANT
SCREW SELF DRILL HT 1.5/4MM (Screw) ×12 IMPLANT
SET CYSTO W/LG BORE CLAMP LF (SET/KITS/TRAYS/PACK) ×2 IMPLANT
SPONGE LAP 4X18 RFD (DISPOSABLE) ×2 IMPLANT
SPONGE NEURO XRAY DETECT 1X3 (DISPOSABLE) IMPLANT
SPONGE SURGIFOAM ABS GEL 100 (HEMOSTASIS) ×2 IMPLANT
STAPLER VISISTAT 35W (STAPLE) ×2 IMPLANT
STOCKINETTE 6  STRL (DRAPES) ×1
STOCKINETTE 6 STRL (DRAPES) ×1 IMPLANT
SUT ETHILON 3 0 FSL (SUTURE) IMPLANT
SUT ETHILON 3 0 PS 1 (SUTURE) IMPLANT
SUT NURALON 4 0 TR CR/8 (SUTURE) ×2 IMPLANT
SUT STEEL 0 (SUTURE)
SUT STEEL 0 18XMFL TIE 17 (SUTURE) IMPLANT
SUT VIC AB 0 CT1 18XCR BRD8 (SUTURE) ×1 IMPLANT
SUT VIC AB 0 CT1 8-18 (SUTURE) ×1
SUT VIC AB 3-0 SH 8-18 (SUTURE) ×4 IMPLANT
TAPE CLOTH 1X10 TAN NS (GAUZE/BANDAGES/DRESSINGS) IMPLANT
TOWEL GREEN STERILE (TOWEL DISPOSABLE) ×2 IMPLANT
TOWEL GREEN STERILE FF (TOWEL DISPOSABLE) ×2 IMPLANT
TRAY FOLEY MTR SLVR 16FR STAT (SET/KITS/TRAYS/PACK) ×2 IMPLANT
TUBE CONNECTING 12X1/4 (SUCTIONS) IMPLANT
UNDERPAD 30X30 (UNDERPADS AND DIAPERS) IMPLANT
WATER STERILE IRR 1000ML POUR (IV SOLUTION) ×2 IMPLANT

## 2018-07-09 NOTE — Transfer of Care (Signed)
Immediate Anesthesia Transfer of Care Note  Patient: Jamie Kirby  Procedure(s) Performed: CRANIOTOMY HEMATOMA EVACUATION SUBDURAL- LEFT (Left Head)  Patient Location: PACU  Anesthesia Type:General  Level of Consciousness: awake, alert  and oriented  Airway & Oxygen Therapy: Patient Spontanous Breathing  Post-op Assessment: Report given to RN, Post -op Vital signs reviewed and stable and Patient moving all extremities X 4  Post vital signs: Reviewed and stable  Last Vitals:  Vitals Value Taken Time  BP 128/87 07/09/2018 10:22 AM  Temp    Pulse 90 07/09/2018 10:23 AM  Resp 16 07/09/2018 10:23 AM  SpO2 100 % 07/09/2018 10:23 AM  Vitals shown include unvalidated device data.  Last Pain:  Vitals:   07/09/18 0706  TempSrc: Oral  PainSc: 0-No pain      Patients Stated Pain Goal: 4 (17/40/81 4481)  Complications: No apparent anesthesia complications

## 2018-07-09 NOTE — Progress Notes (Signed)
Pharmacy Antibiotic Note  Jamie Kirby is a 68 y.o. female admitted on 07/09/2018 with L sDH.  Pharmacy has been consulted for vancomycin dosing for 24hr of surgical prophylaxis. Pt received vancomycin 1000mg  IV x1 in OR ~0730 this am.  Plan: Vancomycin 750mg  IV x1 tomorrow morning Pharmacy will sign off, reconsult if needed.  Height: 5\' 1"  (154.9 cm) Weight: 103 lb (46.7 kg) IBW/kg (Calculated) : 47.8  Temp (24hrs), Avg:97.7 F (36.5 C), Min:97 F (36.1 C), Max:98.6 F (37 C)  Recent Labs  Lab 07/09/18 0725  WBC 8.6  CREATININE 0.85    Estimated Creatinine Clearance: 47.3 mL/min (by C-G formula based on SCr of 0.85 mg/dL).    Allergies  Allergen Reactions  . Ceclor [Cefaclor] Rash    Arrie Senate, PharmD, BCPS Clinical Pharmacist [phone] Please check AMION for all [place] numbers 07/09/2018

## 2018-07-09 NOTE — Anesthesia Procedure Notes (Signed)
Procedure Name: Intubation Date/Time: 07/09/2018 8:53 AM Performed by: Harden Mo, CRNA Pre-anesthesia Checklist: Patient identified, Emergency Drugs available, Suction available and Patient being monitored Patient Re-evaluated:Patient Re-evaluated prior to induction Oxygen Delivery Method: Circle System Utilized Preoxygenation: Pre-oxygenation with 100% oxygen Induction Type: IV induction and Rapid sequence Laryngoscope Size: Miller and 2 Grade View: Grade I Tube type: Oral Tube size: 7.5 mm Number of attempts: 1 Airway Equipment and Method: Stylet and Oral airway Placement Confirmation: ETT inserted through vocal cords under direct vision,  positive ETCO2 and breath sounds checked- equal and bilateral Secured at: 21 cm Tube secured with: Tape Dental Injury: Teeth and Oropharynx as per pre-operative assessment

## 2018-07-09 NOTE — H&P (Signed)
Chief Complaint   Subdural hematoma  HPI   HPI: Jamie Kirby is a 68 y.o. female who was found to have acute bilateral subdural hematomas after a work up for 2 day history of headache nausea with vomiting in March. Subdural hematomas were treated nonoperatively due to small size and she has been undergoing radiographic monitoring with repeat head CTs. Unfortunately, her most recent head CT performed several days ago, while showing complete resolution of right sided SDH, showed continued enlargement of the left sided subdural hematoma with worsening midline shift. Based on size, it was rec that she undergoes surgical evacuation. She presents today for surgery. She is asymptomatic.   Patient Active Problem List   Diagnosis Date Noted  . Subdural hematoma ([place]) 05/17/2018  . SDH (subdural hematoma) ([place]) 05/17/2018    PMH: Past Medical History:  Diagnosis Date  . Anxiety   . Brain lesion    Left Occipital  . Depression   . Headache   . Osteoporosis   . Subdural hematoma ([facility])    left    PSH: Past Surgical History:  Procedure Laterality Date  . TONSILLECTOMY      Medications Prior to Admission  Medication Sig Dispense Refill Last Dose  . acetaminophen (TYLENOL) 500 MG tablet Take 500 mg by mouth daily as needed for headache.   Past Week at Unknown time  . Carboxymethylcellul-Glycerin (LUBRICATING EYE DROPS OP) Place 1 drop into both eyes daily as needed (dry eyes).   07/09/2018 at 0530  . methylPREDNISolone (MEDROL DOSEPAK) 4 MG TBPK tablet Take 4-24 mg by mouth. (Typical regimens for 21 tablet dose packs of Methylprednisolone 4mg , Prednisone 5mg , and Prednisone 10mg ) Day 1: 2 tabs before breakfast, 1 tab after lunch, 1 tab after supper, and 2 tabs at bedtime. Day 2: 1 tab before breakfast, 1 tab after lunch, 1 tab after supper, and 2 tabs at bedtime. Day 3: 1 tab before breakfast, 1 tab after lunch, 1 tab after supper, and 1 tab at bedtime. Day 4: 1 tab before breakfast, 1 tab  after lunch, and 1 tab at bedtime. Day 5: 1 tab before breakfast and 1 tab at bedtime. Day 6: 1 tab before breakfast.   07/09/2018 at 0530  . Multiple Vitamin (MULTIVITAMIN) capsule Take 1 capsule by mouth daily.    07/08/2018 at Unknown time  . raloxifene (EVISTA) 60 MG tablet Take 60 mg by mouth every other day.   07/08/2018 at Unknown time  . sertraline (ZOLOFT) 25 MG tablet Take 25 mg by mouth daily.    07/09/2018 at 0530  . ALPRAZolam (XANAX) 0.5 MG tablet Take 1-2 pills as needed on call to MRI. (Patient not taking: Reported on 07/08/2018) 2 tablet 0 Completed Course at Unknown time    SH: Social History   Tobacco Use  . Smoking status: Never Smoker  . Smokeless tobacco: Never Used  Substance Use Topics  . Alcohol use: Not Currently  . Drug use: Never    MEDS: Prior to Admission medications   Medication Sig Start Date End Date Taking? Authorizing Provider  acetaminophen (TYLENOL) 500 MG tablet Take 500 mg by mouth daily as needed for headache.   Yes [provider]  Carboxymethylcellul-Glycerin (LUBRICATING EYE DROPS OP) Place 1 drop into both eyes daily as needed (dry eyes).   Yes [provider]  methylPREDNISolone (MEDROL DOSEPAK) 4 MG TBPK tablet Take 4-24 mg by mouth. (Typical regimens for 21 tablet dose packs of Methylprednisolone 4mg , Prednisone 5mg , and Prednisone 10mg )  Day 1: 2 tabs before breakfast, 1 tab after lunch, 1 tab after supper, and 2 tabs at bedtime. Day 2: 1 tab before breakfast, 1 tab after lunch, 1 tab after supper, and 2 tabs at bedtime. Day 3: 1 tab before breakfast, 1 tab after lunch, 1 tab after supper, and 1 tab at bedtime. Day 4: 1 tab before breakfast, 1 tab after lunch, and 1 tab at bedtime. Day 5: 1 tab before breakfast and 1 tab at bedtime. Day 6: 1 tab before breakfast.   Yes [provider]  Multiple Vitamin (MULTIVITAMIN) capsule Take 1 capsule by mouth daily.    Yes [provider]  raloxifene (EVISTA) 60 MG tablet  Take 60 mg by mouth every other day.   Yes [provider]  sertraline (ZOLOFT) 25 MG tablet Take 25 mg by mouth daily.    Yes [provider]  ALPRAZolam Duanne Moron) 0.5 MG tablet Take 1-2 pills as needed on call to MRI. Patient not taking: Reported on 07/08/2018 09/18/17   [name], MD    ALLERGY: Allergies  Allergen Reactions  . Ceclor [Cefaclor] Rash    Social History   Tobacco Use  . Smoking status: Never Smoker  . Smokeless tobacco: Never Used  Substance Use Topics  . Alcohol use: Not Currently     History reviewed. No pertinent family history.   ROS   ROS  Exam   Vitals:   07/09/18 0706  BP: 127/84  Pulse: 85  Resp: 16  Temp: 98.6 F (37 C)  SpO2: 100%   General appearance: WDWN, NAD Eyes: No scleral injection Cardiovascular: Regular rate and rhythm without murmurs, rubs, gallops. No edema or variciosities. Distal pulses normal. Pulmonary: Effort normal, non-labored breathing Musculoskeletal:     Muscle tone upper extremities: Normal    Muscle tone lower extremities: Normal    Motor exam: Upper Extremities Deltoid Bicep Tricep Grip  Right 5/5 5/5 5/5 5/5  Left 5/5 5/5 5/5 5/5   Lower Extremity IP Quad PF DF EHL  Right 5/5 5/5 5/5 5/5 5/5  Left 5/5 5/5 5/5 5/5 5/5   Neurological Mental Status:    - Patient is awake, alert, oriented to person, place, month, year, and situation    - Patient is able to give a clear and coherent history.    - No signs of aphasia or neglect Cranial Nerves    - II: Visual Fields are full. PERRL    - III/IV/VI: EOMI without ptosis or diploplia.     - V: Facial sensation is grossly normal    - VII: Facial movement is symmetric.     - VIII: hearing is intact to voice    - X: Uvula elevates symmetrically    - XI: Shoulder shrug is symmetric.    - XII: tongue is midline without atrophy or fasciculations.  Sensory: Sensation grossly intact to LT  Results - Imaging/Labs   No results found for this or any  previous visit (from the past 48 hour(s)).  No results found.  IMAGING:   Impression/Plan   68 y.o. female with enlarging left sided chronic subdural hematoma with associated 35mm midline shift. She is neurologically intact on exam and relatively asymptomatic. She presents today for left craniotomy for evacuation of subdural hematoma. While in the office risks, benefits and alternatives to the procedure were discussed. Patient stated in own language understanding and wished to proceed.  Ferne Reus, PA-C [facility] [employer]

## 2018-07-09 NOTE — Anesthesia Procedure Notes (Signed)
Arterial Line Insertion Start/End5/07/2018 8:00 AM, 07/09/2018 8:05 AM Performed by: Harden Mo, CRNA, CRNA  Patient location: Pre-op. Preanesthetic checklist: patient identified, IV checked, site marked, risks and benefits discussed, surgical consent, monitors and equipment checked, pre-op evaluation and anesthesia consent Lidocaine 1% used for infiltration Left, radial was placed Catheter size: 20 G Hand hygiene performed  and maximum sterile barriers used   Attempts: 1 Procedure performed without using ultrasound guided technique. Ultrasound Notes:anatomy identified, needle tip was noted to be adjacent to the nerve/plexus identified and no ultrasound evidence of intravascular and/or intraneural injection Following insertion, dressing applied and Biopatch. Post procedure assessment: normal  Patient tolerated the procedure well with no immediate complications.

## 2018-07-09 NOTE — Op Note (Signed)
  NEUROSURGERY OPERATIVE NOTE   PREOP DIAGNOSIS: Left subdural hematoma  POSTOP DIAGNOSIS: Same  PROCEDURE: 1. Left craniotomy for evacuation of subdural hematoma  SURGEON: Dr. Consuella Lose, MD  ASSISTANT: Ferne Reus, PA-C  ANESTHESIA: General Endotracheal  EBL: 50cc  SPECIMENS: None  DRAINS: Subdural JP  COMPLICATIONS: None immediate  CONDITION: Hemodynamically stable to PACU  HISTORY: Jamie Kirby is a 68 y.o. 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  She was subsequently found to have small bilateral subdural hematomas which we have monitored radiographically.  While she remains largely clinically asymptomatic, serial radiologic studies have demonstrated progressive enlargement of the left-sided subdural hematoma with associated local mass-effect and midline shift.  We therefore elected to proceed with surgical evacuation of the subdural hematoma.  Risks and benefits of the surgery were reviewed in detail with the patient and her husband.  After all questions were answered informed consent was obtained and witnessed.  PROCEDURE IN DETAIL: After informed consent was obtained and witnessed, the patient was brought to the operating room. After induction of general anesthesia, the patient was positioned on the operative table in the supine position. All pressure points were meticulously padded. Skin incision was then marked out and prepped and draped in the usual sterile fashion.  After timeout was conducted, the skin incision was infiltrated with local anesthetic. Skin incision was then made sharply, and Bovie electrocautery was used to dissect the subcutaneous tissue and the galea was incised. Hemostasis was achieved on the skin edges with Raney clips. A single piece myocutaneous flap was then elevated and retracted.  A single bur hole was created and a craniotomy was fashioned and elevated. Hemostasis was then achieved on the dural  surface with bipolar electrocautery. The dura was then opened in cruciate fashion with monopolar.  Immediately subjacent to the dura identified a fairly thick chronic subdural membrane.  This was also opened with electrocautery.  Chronic subdural fluid was encountered under some pressure and was evacuated.  There was a significant amount of subdural granulation tissue in addition to the chronic hematoma fluid.  This was removed easily with suction.  The deep subdural membrane was then elevated and incised.  At this point, the subdural space was irrigated with approximately 1-1/2 L of normal saline irrigation using a red rubber catheter directed in all directions.  This was done until the irrigant ran clear.  A JP drain was then placed and tunneled subcutaneously.  A piece of Gelfoam was then placed over the dural leaflets.  The bone flap was replaced and plated with standard titanium plates and screws.  The temporalis muscle and fascia as well as the galea were reapproximated with interrupted 0 Vicryl stitches.  Skin was closed with staples.  The drain was secured with a stitch.  At the end of the case all sponge, needle, instrument, and cottonoid counts were correct.  Patient was then extubated and taken to the postanesthesia care unit in stable hemodynamic condition.

## 2018-07-09 NOTE — Anesthesia Postprocedure Evaluation (Signed)
Anesthesia Post Note  Patient: Jamie Kirby  Procedure(s) Performed: CRANIOTOMY HEMATOMA EVACUATION SUBDURAL- LEFT (Left Head)     Patient location during evaluation: PACU Anesthesia Type: General Level of consciousness: sedated and patient cooperative Pain management: pain level controlled Vital Signs Assessment: post-procedure vital signs reviewed and stable Respiratory status: spontaneous breathing Cardiovascular status: stable Anesthetic complications: no    Last Vitals:  Vitals:   07/09/18 1600 07/09/18 1700  BP: 110/76 120/75  Pulse: 82 80  Resp: 12 10  Temp:    SpO2: 97% 95%    Last Pain:  Vitals:   07/09/18 1500  TempSrc: Oral  PainSc:                  Nolon Nations

## 2018-07-10 ENCOUNTER — Inpatient Hospital Stay (HOSPITAL_COMMUNITY): Payer: Medicare Other

## 2018-07-10 LAB — BASIC METABOLIC PANEL
Anion gap: 12 (ref 5–15)
BUN: 12 mg/dL (ref 8–23)
CO2: 25 mmol/L (ref 22–32)
Calcium: 9 mg/dL (ref 8.9–10.3)
Chloride: 103 mmol/L (ref 98–111)
Creatinine, Ser: 0.73 mg/dL (ref 0.44–1.00)
GFR calc Af Amer: >60 mL/min (ref >60)
GFR calc non Af Amer: >60 mL/min (ref >60)
Glucose, Bld: 111 mg/dL — ABNORMAL HIGH (ref 70–99)
Potassium: 4.1 mmol/L (ref 3.5–5.1)
Sodium: 140 mmol/L (ref 135–145)

## 2018-07-10 LAB — MRSA PCR SCREENING: MRSA by PCR: NEGATIVE

## 2018-07-10 LAB — CBC
HCT: 31.6 % — ABNORMAL LOW (ref 36.0–46.0)
Hemoglobin: 10.3 g/dL — ABNORMAL LOW (ref 12.0–15.0)
MCH: 31.4 pg (ref 26.0–34.0)
MCHC: 32.6 g/dL (ref 30.0–36.0)
MCV: 96.3 fL (ref 80.0–100.0)
Platelets: 229 10*3/uL (ref 150–400)
RBC: 3.28 MIL/uL — ABNORMAL LOW (ref 3.87–5.11)
RDW: 11.9 % (ref 11.5–15.5)
WBC: 14 10*3/uL — ABNORMAL HIGH (ref 4.0–10.5)
nRBC: 0 % (ref 0.0–0.2)

## 2018-07-10 NOTE — Progress Notes (Signed)
  NEUROSURGERY PROGRESS NOTE   No issues overnight. Minimal HA this am, no other c/o.  EXAM:  BP 109/76   Pulse 79   Temp 98.4 F (36.9 C) (Oral)   Resp 17   Ht 5\' 1"  (1.549 m)   Wt 46.7 kg   SpO2 98%   BMI 19.46 kg/m   Awake, alert, oriented  Speech fluent, appropriate  CN grossly intact  5/5 BUE/BLE  Dressing c/d/i, JP in place ~120cc bloody drainage  CTH reviewed, significant improvement in left convexity SDH, improvement in local mass effect with decreased MLS.  IMPRESSION:  68 y.o. female POD#1 s/p left crani for SDH, doing well  PLAN: - PT/OT, OOB today - Will keep JP for another 24hrs.

## 2018-07-10 NOTE — Evaluation (Signed)
Occupational Therapy Evaluation Patient Details Name: Jamie Kirby MRN: [id number] DOB: 22-Mar-1950 Today's Date: 07/10/2018    History of Present Illness 68 yo female s/p L craniotomy due to L SDH and pt present asymptomatic. PMH: BIL SDH in march 2020, brain lesion L occipital, osteoporosis.    Clinical Impression   Patient is s/p L craniotomy for L SDH surgery resulting in functional limitations due to the deficits listed below (see OT problem list). Pt will need education from MD regarding any restrictions for lifting as she cares for 3 small children. OT to continue to follow for IADLs and energy conservation upon d/c.  Patient will benefit from skilled OT acutely to increase independence and safety with ADLS to allow discharge home without follow. MD please discuss driving and ability to provide care for 3 small children upon d/c.     Follow Up Recommendations  No OT follow up    Equipment Recommendations  None recommended by OT    Recommendations for Other Services       Precautions / Restrictions Precautions Precautions: Other (comment) Precaution Comments: JP drain      Mobility Bed Mobility Overal bed mobility: Modified Independent                Transfers Overall transfer level: Modified independent                    Balance                                           ADL either performed or assessed with clinical judgement   ADL Overall ADL's : Modified independent                                       General ADL Comments: demonstrates bed mobility, toilet transfer, sink level adl and picking up an object off the floor. 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 Ot will continue to work with patient on iadls and babysitting acoommodations next session     Vision Baseline Vision/History: Wears glasses Wears Glasses: At all times       Perception     Praxis      Pertinent Vitals/Pain  Pain Assessment: No/denies pain     Hand Dominance Right   Extremity/Trunk Assessment Upper Extremity Assessment Upper Extremity Assessment: Overall WFL for tasks assessed       Cervical / Trunk Assessment Cervical / Trunk Assessment: Normal   Communication Communication Communication: No difficulties   Cognition Arousal/Alertness: Awake/alert Behavior During Therapy: WFL for tasks assessed/performed Overall Cognitive Status: Within Functional Limits for tasks assessed                                     General Comments  able to turn head right and read clock and ambulate    Exercises     Shoulder Instructions      Home Living Family/patient expects to be discharged to:: Private residence Living Arrangements: Spouse/significant other Available Help at Discharge: Family;Available 24 hours/day Type of Home: House Home Access: Stairs to enter [employer] of Steps: 1   Home Layout: Two level;Able to live on main level with  bedroom/bathroom Alternate Level Stairs-Number of Steps: (flight)   Bathroom Shower/Tub: Tub/shower unit;Walk-in shower         Home Equipment: None   Additional Comments: reports spouse will be present 24/ 7      Prior Functioning/Environment Level of Independence: Independent        Comments: previously worked in [employer] prior to [place] List: Decreased strength;Decreased activity tolerance;Impaired balance (sitting and/or standing);Decreased safety awareness      OT Treatment/Interventions: Self-care/ADL training;Therapeutic exercise;Energy conservation;DME and/or AE instruction;Balance training;Patient/family education    OT Goals(Current goals can be found in the care plan section) Acute Rehab OT Goals Patient Stated Goal: "take care of my grandchildren." OT Goal Formulation: With patient Time For Goal Achievement: 07/24/18 Potential to Achieve Goals: Good  OT Frequency: Min  2X/week   Barriers to D/C:            Co-evaluation              AM-PAC OT "6 Clicks" Daily Activity     Outcome Measure Help from another person eating meals?: None Help from another person taking care of personal grooming?: None Help from another person toileting, which includes using toliet, bedpan, or urinal?: None Help from another person bathing (including washing, rinsing, drying)?: None Help from another person to put on and taking off regular upper body clothing?: None Help from another person to put on and taking off regular lower body clothing?: None 6 Click Score: 24   End of Session Equipment Utilized During Treatment: Gait belt Nurse Communication: Mobility status;Precautions  Activity Tolerance: Patient tolerated treatment well Patient left: Other (comment)(with PT Chrys Racer in the hall)  OT Visit Diagnosis: Unsteadiness on feet (R26.81);Muscle weakness (generalized) (M62.81)                Time: 4709-6283 OT Time Calculation (min): 17 min Charges:  OT General Charges $OT Visit: 1 Visit OT Evaluation $OT Eval Moderate Complexity: 1 Mod   Jeri Modena, OTR/L  Acute Rehabilitation Services Pager: [phone] Office: [phone] .   Jeri Modena 07/10/2018, 1:59 PM

## 2018-07-10 NOTE — Evaluation (Signed)
Physical Therapy Evaluation Patient Details Name: Jamie Kirby MRN: [id number] DOB: 1950/07/19 Today's Date: 07/10/2018   History of Present Illness  Pt is a 68 y.o. F with no significant PMH who was found to have acute bilateral subdural hematomas after a work up for headache and nausea with vomiting in March. Subdural hematomas were initially treated nonoperatively. Repeat head CT showed complete resolution of right sided SDH and continued enlargement of left sided subdural hematoma with worsening midline shift. Now s/p left craniotomy for evacuation of subdural hematoma on 07/09/2018.   Clinical Impression  Patient evaluated by Physical Therapy with no further acute PT needs identified. Pt reporting good pain control. On PT evaluation, pt ambulating hallway distances independently and able to perform high level balance activities I.e. head turns, stop/starts, varying gait speeds, and stepping over obstacles without loss of balance. All education has been completed and the patient has no further questions. Nofollow-up Physical Therapy or equipment needs. PT is signing off. Thank you for this referral.     Follow Up Recommendations No PT follow up    Equipment Recommendations  None recommended by PT    Recommendations for Other Services       Precautions / Restrictions Precautions Precautions: Other (comment) Precaution Comments: drain Restrictions Weight Bearing Restrictions: No      Mobility  Bed Mobility Overal bed mobility: Modified Independent             General bed mobility comments: HOB up  Transfers Overall transfer level: Independent Equipment used: None                Ambulation/Gait Ambulation/Gait assistance: Independent Gait Distance (Feet): 250 Feet Assistive device: None Gait Pattern/deviations: WFL(Within Functional Limits)   Gait velocity interpretation: >2.62 ft/sec, indicative of community ambulatory General Gait Details: Able to perform high  level balance activities independently  Stairs            Wheelchair Mobility    Modified Rankin (Stroke Patients Only) Modified Rankin (Stroke Patients Only) Pre-Morbid Rankin Score: No symptoms Modified Rankin: No significant disability     Balance Overall balance assessment: Independent                                           Pertinent Vitals/Pain Pain Assessment: No/denies pain    Home Living Family/patient expects to be discharged to:: Private residence Living Arrangements: Spouse/significant other Available Help at Discharge: Family;Available 24 hours/day Type of Home: House Home Access: Stairs to enter   [employer] of Steps: 1 Home Layout: Two level;Able to live on main level with bedroom/bathroom Home Equipment: None      Prior Function Level of Independence: Independent         Comments: previously worked in [employer] prior to [employer]        Extremity/Trunk Assessment   Upper Extremity Assessment Upper Extremity Assessment: Defer to OT evaluation    Lower Extremity Assessment Lower Extremity Assessment: Overall WFL for tasks assessed    Cervical / Trunk Assessment Cervical / Trunk Assessment: Normal  Communication   Communication: No difficulties  Cognition Arousal/Alertness: Awake/alert Behavior During Therapy: WFL for tasks assessed/performed Overall Cognitive Status: Within Functional Limits for tasks assessed  General Comments  VSS    Exercises     Assessment/Plan    PT Assessment Patent does not need any further PT services  PT Problem List         PT Treatment Interventions      PT Goals (Current goals can be found in the Care Plan section)  Acute Rehab PT Goals Patient Stated Goal: "take care of my grandchildren." PT Goal Formulation: All assessment and education complete, DC therapy    Frequency      Barriers to discharge        Co-evaluation               AM-PAC PT "6 Clicks" Mobility  Outcome Measure Help needed turning from your back to your side while in a flat bed without using bedrails?: None Help needed moving from lying on your back to sitting on the side of a flat bed without using bedrails?: None Help needed moving to and from a bed to a chair (including a wheelchair)?: None Help needed standing up from a chair using your arms (e.g., wheelchair or bedside chair)?: None Help needed to walk in hospital room?: None Help needed climbing 3-5 steps with a railing? : None 6 Click Score: 24    End of Session   Activity Tolerance: Patient tolerated treatment well Patient left: in chair;with call bell/phone within reach Nurse Communication: Mobility status PT Visit Diagnosis: Pain;Unsteadiness on feet (R26.81) Pain - part of body: (head)    Time: 7902-4097 PT Time Calculation (min) (ACUTE ONLY): 20 min   Charges:   PT Evaluation $PT Eval Low Complexity: 1 Low          Ellamae Sia, PT, DPT Acute Rehabilitation Services Pager [phone] Office [place] 07/10/2018, 9:42 AM

## 2018-07-11 ENCOUNTER — Encounter (HOSPITAL_COMMUNITY): Payer: Self-pay | Admitting: Neurosurgery

## 2018-07-11 NOTE — Plan of Care (Signed)

## 2018-07-11 NOTE — Progress Notes (Addendum)
  NEUROSURGERY PROGRESS NOTE   No issues overnight.  Complains of mild headache Worked with therapy yesterday  EXAM:  BP 99/66   Pulse 82   Temp 98 F (36.7 C) (Oral)   Resp 19   Ht 5\' 1"  (1.549 m)   Wt 46.7 kg   SpO2 100%   BMI 19.46 kg/m   Awake, alert, oriented  Speech fluent, appropriate  CN grossly intact  5/5 BUE/BLE  Incision c/d/i Drain 75cc 24 hours  IMPRESSION/PLAN 68 y.o. female s/p left craniotomy for evacuation of SDH. Progressing well. ABL - Drain removed  - continue to monitor with planned d/c tomorrow ABL: Asx. No indication for transfusion

## 2018-07-11 NOTE — Progress Notes (Signed)
Occupational Therapy Treatment Patient Details Name: Jamie Kirby MRN: [id number] DOB: June 12, 1950 Today's Date: 07/11/2018    History of present illness 68 yo female s/p L craniotomy due to L SDH and pt present asymptomatic. PMH: BIL SDH in march 2020, brain lesion L occipital, osteoporosis.    OT comments  Pt is able to perform ADLs mod I/Independent. Cognition appears intact.  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  All OT needs and education addressed.  OT will sign off at this time. Pt hopeful for discharge home today or in the am.   Follow Up Recommendations  No OT follow up    Equipment Recommendations  None recommended by OT    Recommendations for Other Services      Precautions / Restrictions Precautions Precautions: Other (comment) Precaution Comments: JP drain       Mobility Bed Mobility Overal bed mobility: Modified Independent                Transfers Overall transfer level: Independent                    Balance Overall balance assessment: Independent                                         ADL either performed or assessed with clinical judgement   ADL Overall ADL's : Modified independent                                             Vision   Vision Assessment?: No apparent visual deficits   Perception     Praxis      Cognition Arousal/Alertness: Awake/alert Behavior During Therapy: WFL for tasks assessed/performed Overall Cognitive Status: Within Functional Limits for tasks assessed                                 General Comments: Pt able to perform path finding with supervision         Exercises     Shoulder Instructions       General Comments Pt reports she will not be babysitting upon discharge.     Pertinent Vitals/ Pain       Pain Assessment: No/denies pain  Home Living                                          Prior  Functioning/Environment              Frequency  Min 2X/week        Progress Toward Goals  OT Goals(current goals can now be found in the care plan section)        Plan Discharge plan remains appropriate    Co-evaluation                 AM-PAC OT "6 Clicks" Daily Activity     Outcome Measure   Help from another person eating meals?: None Help from another person taking care of personal grooming?: None Help from another person toileting, which includes using toliet, bedpan, or urinal?: None Help from another  person bathing (including washing, rinsing, drying)?: None Help from another person to put on and taking off regular upper body clothing?: None Help from another person to put on and taking off regular lower body clothing?: None 6 Click Score: 24    End of Session    OT Visit Diagnosis: Unsteadiness on feet (R26.81);Muscle weakness (generalized) (M62.81)   Activity Tolerance Patient tolerated treatment well   Patient Left in bed;with call bell/phone within reach   Nurse Communication Mobility status        Time: 0397-9536 OT Time Calculation (min): 26 min  Charges: OT General Charges $OT Visit: 1 Visit OT Treatments $Therapeutic Activity: 23-37 mins  Lucille Passy, OTR/L Acute Rehabilitation Services Pager [phone] Office [place], [place] 07/11/2018, 12:27 PM

## 2018-07-12 MED ORDER — LEVETIRACETAM 500 MG PO TABS: 500.0000 mg | ORAL_TABLET | Freq: Two times a day (BID) | ORAL | 1 refills | Status: AC

## 2018-07-12 MED ORDER — OXYCODONE-ACETAMINOPHEN 5-325 MG PO TABS: 1.0000 | ORAL_TABLET | ORAL | 0 refills | Status: AC | PRN

## 2018-07-12 NOTE — Discharge Summary (Signed)
Physician Discharge Summary  Patient ID: Jamie Kirby MRN: [id number] DOB/AGE: 67-Nov-1952 67 y.o.  Admit date: 07/09/2018 Discharge date: 07/12/2018  Admission Diagnoses:  SDH  Discharge Diagnoses:  Same Active Problems:   Subdural hematoma (HCC)   SDH (subdural hematoma) [facility])   Discharged Condition: Stable  Hospital Course:  Jamie Kirby is a 68 y.o. female who was admitted for the below procedure. There were no post operative complications. At time of discharge, pain was well controlled, ambulating with Pt/OT, tolerating po, voiding normal. Ready for discharge.  Treatments: Surgery - left craniotomy for evacuation of SDH  Discharge Exam: Blood pressure 119/80, pulse 74, temperature 98 F (36.7 C), temperature source Oral, resp. rate 13, height 5\' 1"  (1.549 m), weight 46.7 kg, SpO2 97 %. Awake, alert, oriented Speech fluent, appropriate CN grossly intact 5/5 BUE/BLE Wound c/d/i  Disposition: Discharge disposition: 01-Home or Self Care       Discharge Instructions    Call MD for:  difficulty breathing, headache or visual disturbances   Complete by:  As directed    Call MD for:  persistant dizziness or light-headedness   Complete by:  As directed    Call MD for:  redness, tenderness, or signs of infection (pain, swelling, redness, odor or green/yellow discharge around incision site)   Complete by:  As directed    Call MD for:  severe uncontrolled pain   Complete by:  As directed    Call MD for:  temperature >100.4   Complete by:  As directed    Diet general   Complete by:  As directed    Driving Restrictions   Complete by:  As directed    Do not drive until given clearance.   Increase activity slowly   Complete by:  As directed    Lifting restrictions   Complete by:  As directed    Do not lift anything >10lbs. Avoid bending and twisting in awkward positions. Avoid bending at the back.   May shower / Bathe   Complete by:  As directed    In 24 hours. Okay to  wash wound with warm soapy water. Avoid scrubbing the wound. Pat dry.   Remove dressing in 24 hours   Complete by:  As directed      Allergies as of 07/12/2018      Reactions   Ceclor [cefaclor] Rash      Medication List    TAKE these medications   acetaminophen 500 MG tablet Commonly known as:  TYLENOL Take 500 mg by mouth daily as needed for headache.   ALPRAZolam 0.5 MG tablet Commonly known as:  XANAX Take 1-2 pills as needed on call to MRI.   levETIRAcetam 500 MG tablet Commonly known as:  Keppra Take 1 tablet (500 mg total) by mouth 2 (two) times daily.   LUBRICATING EYE DROPS OP Place 1 drop into both eyes daily as needed (dry eyes).   methylPREDNISolone 4 MG Tbpk tablet Commonly known as:  MEDROL DOSEPAK Take 4-24 mg by mouth. (Typical regimens for 21 tablet dose packs of Methylprednisolone 4mg , Prednisone 5mg , and Prednisone 10mg ) Day 1: 2 tabs before breakfast, 1 tab after lunch, 1 tab after supper, and 2 tabs at bedtime. Day 2: 1 tab before breakfast, 1 tab after lunch, 1 tab after supper, and 2 tabs at bedtime. Day 3: 1 tab before breakfast, 1 tab after lunch, 1 tab after supper, and 1 tab at bedtime. Day 4: 1 tab before breakfast, 1 tab after lunch,  and 1 tab at bedtime. Day 5: 1 tab before breakfast and 1 tab at bedtime. Day 6: 1 tab before breakfast.   multivitamin capsule Take 1 capsule by mouth daily.   oxyCODONE-acetaminophen 5-325 MG tablet Commonly known as:  Percocet Take 1 tablet by mouth every 4 (four) hours as needed for severe pain.   raloxifene 60 MG tablet Commonly known as:  EVISTA Take 60 mg by mouth every other day.   sertraline 25 MG tablet Commonly known as:  ZOLOFT Take 25 mg by mouth daily.      Follow-up Information    Consuella Lose, MD. Schedule an appointment as soon as possible for a visit in 2 week(s).   Specialty:  Neurosurgery Contact information: [address] [place]  [address] [phone]           Signed: Traci Sermon 07/12/2018, 8:28 AM

## 2018-07-16 ENCOUNTER — Other Ambulatory Visit: Payer: Self-pay

## 2018-07-16 ENCOUNTER — Other Ambulatory Visit (HOSPITAL_COMMUNITY)
Admission: RE | Admit: 2018-07-16 | Discharge: 2018-07-16 | Disposition: A | Discharge status: Home / Self Care | Payer: Medicare Other | Source: Ambulatory Visit | Attending: Internal Medicine | Admitting: Internal Medicine

## 2018-07-16 DIAGNOSIS — Z20828 Contact with and (suspected) exposure to other viral communicable diseases: Secondary | ICD-10-CM | POA: Insufficient documentation

## 2018-07-16 LAB — SARS CORONAVIRUS 2 BY RT PCR (HOSPITAL ORDER, PERFORMED IN ~~LOC~~ HOSPITAL LAB): SARS Coronavirus 2: NEGATIVE

## 2018-07-18 ENCOUNTER — Telehealth: Payer: Self-pay | Admitting: *Deleted

## 2018-07-18 NOTE — Telephone Encounter (Signed)
TC to inform Covid-19 test is negative.

## 2018-07-25 ENCOUNTER — Other Ambulatory Visit: Payer: Self-pay | Admitting: Neurosurgery

## 2018-07-25 DIAGNOSIS — S065X9A Traumatic subdural hemorrhage with loss of consciousness of unspecified duration, initial encounter: Secondary | ICD-10-CM

## 2018-07-25 DIAGNOSIS — S065XAA Traumatic subdural hemorrhage with loss of consciousness status unknown, initial encounter: Secondary | ICD-10-CM

## 2018-08-07 ENCOUNTER — Ambulatory Visit
Admission: RE | Admit: 2018-08-07 | Discharge: 2018-08-07 | Disposition: A | Discharge status: Home / Self Care | Payer: Medicare Other | Source: Ambulatory Visit | Attending: Neurosurgery | Admitting: Neurosurgery

## 2018-08-07 DIAGNOSIS — S065X0D Traumatic subdural hemorrhage without loss of consciousness, subsequent encounter: Secondary | ICD-10-CM | POA: Diagnosis not present

## 2018-08-07 DIAGNOSIS — S065X9A Traumatic subdural hemorrhage with loss of consciousness of unspecified duration, initial encounter: Secondary | ICD-10-CM

## 2018-08-07 DIAGNOSIS — S065XAA Traumatic subdural hemorrhage with loss of consciousness status unknown, initial encounter: Secondary | ICD-10-CM

## 2018-08-20 ENCOUNTER — Other Ambulatory Visit: Payer: Self-pay | Admitting: Physician Assistant

## 2018-08-20 DIAGNOSIS — S065XAA Traumatic subdural hemorrhage with loss of consciousness status unknown, initial encounter: Secondary | ICD-10-CM

## 2018-08-20 DIAGNOSIS — S065X9A Traumatic subdural hemorrhage with loss of consciousness of unspecified duration, initial encounter: Secondary | ICD-10-CM

## 2018-08-26 DIAGNOSIS — H16143 Punctate keratitis, bilateral: Secondary | ICD-10-CM | POA: Diagnosis not present

## 2018-08-26 DIAGNOSIS — H04123 Dry eye syndrome of bilateral lacrimal glands: Secondary | ICD-10-CM | POA: Diagnosis not present

## 2018-09-04 ENCOUNTER — Other Ambulatory Visit: Payer: Self-pay

## 2018-09-04 ENCOUNTER — Ambulatory Visit
Admission: RE | Admit: 2018-09-04 | Discharge: 2018-09-04 | Disposition: A | Discharge status: Home / Self Care | Payer: Medicare Other | Source: Ambulatory Visit | Attending: Physician Assistant | Admitting: Physician Assistant

## 2018-09-04 DIAGNOSIS — S065X9A Traumatic subdural hemorrhage with loss of consciousness of unspecified duration, initial encounter: Secondary | ICD-10-CM

## 2018-09-04 DIAGNOSIS — I6200 Nontraumatic subdural hemorrhage, unspecified: Secondary | ICD-10-CM | POA: Diagnosis not present

## 2018-09-04 DIAGNOSIS — S065XAA Traumatic subdural hemorrhage with loss of consciousness status unknown, initial encounter: Secondary | ICD-10-CM

## 2018-09-20 DIAGNOSIS — L814 Other melanin hyperpigmentation: Secondary | ICD-10-CM | POA: Diagnosis not present

## 2018-09-20 DIAGNOSIS — D229 Melanocytic nevi, unspecified: Secondary | ICD-10-CM | POA: Diagnosis not present

## 2018-09-20 DIAGNOSIS — L219 Seborrheic dermatitis, unspecified: Secondary | ICD-10-CM | POA: Diagnosis not present

## 2018-09-20 DIAGNOSIS — L738 Other specified follicular disorders: Secondary | ICD-10-CM | POA: Diagnosis not present

## 2018-10-15 DIAGNOSIS — Z01419 Encounter for gynecological examination (general) (routine) without abnormal findings: Secondary | ICD-10-CM | POA: Diagnosis not present

## 2018-10-15 DIAGNOSIS — Z681 Body mass index (BMI) 19 or less, adult: Secondary | ICD-10-CM | POA: Diagnosis not present

## 2018-10-22 DIAGNOSIS — D259 Leiomyoma of uterus, unspecified: Secondary | ICD-10-CM | POA: Diagnosis not present

## 2018-11-13 ENCOUNTER — Other Ambulatory Visit: Payer: Self-pay | Admitting: Neurosurgery

## 2018-11-13 DIAGNOSIS — S065XAA Traumatic subdural hemorrhage with loss of consciousness status unknown, initial encounter: Secondary | ICD-10-CM

## 2018-11-13 DIAGNOSIS — S065X9A Traumatic subdural hemorrhage with loss of consciousness of unspecified duration, initial encounter: Secondary | ICD-10-CM

## 2018-11-14 ENCOUNTER — Ambulatory Visit
Admission: RE | Admit: 2018-11-14 | Discharge: 2018-11-14 | Disposition: A | Discharge status: Home / Self Care | Payer: Medicare Other | Source: Ambulatory Visit | Attending: Neurosurgery | Admitting: Neurosurgery

## 2018-11-14 DIAGNOSIS — S065X9A Traumatic subdural hemorrhage with loss of consciousness of unspecified duration, initial encounter: Secondary | ICD-10-CM

## 2018-11-14 DIAGNOSIS — S065XAA Traumatic subdural hemorrhage with loss of consciousness status unknown, initial encounter: Secondary | ICD-10-CM

## 2018-11-14 DIAGNOSIS — I6200 Nontraumatic subdural hemorrhage, unspecified: Secondary | ICD-10-CM | POA: Diagnosis not present

## 2018-11-19 ENCOUNTER — Other Ambulatory Visit: Payer: Medicare Other

## 2018-12-02 DIAGNOSIS — Z23 Encounter for immunization: Secondary | ICD-10-CM | POA: Diagnosis not present

## 2018-12-17 ENCOUNTER — Other Ambulatory Visit (HOSPITAL_COMMUNITY): Payer: Self-pay | Admitting: Neurosurgery

## 2018-12-17 ENCOUNTER — Other Ambulatory Visit: Payer: Self-pay | Admitting: Neurosurgery

## 2018-12-17 DIAGNOSIS — M899 Disorder of bone, unspecified: Secondary | ICD-10-CM

## 2018-12-23 DIAGNOSIS — Z0000 Encounter for general adult medical examination without abnormal findings: Secondary | ICD-10-CM | POA: Diagnosis not present

## 2018-12-23 DIAGNOSIS — Z131 Encounter for screening for diabetes mellitus: Secondary | ICD-10-CM | POA: Diagnosis not present

## 2018-12-23 DIAGNOSIS — M810 Age-related osteoporosis without current pathological fracture: Secondary | ICD-10-CM | POA: Diagnosis not present

## 2018-12-23 DIAGNOSIS — H04123 Dry eye syndrome of bilateral lacrimal glands: Secondary | ICD-10-CM | POA: Diagnosis not present

## 2018-12-23 DIAGNOSIS — Z1211 Encounter for screening for malignant neoplasm of colon: Secondary | ICD-10-CM | POA: Diagnosis not present

## 2018-12-23 DIAGNOSIS — F419 Anxiety disorder, unspecified: Secondary | ICD-10-CM | POA: Diagnosis not present

## 2019-01-20 ENCOUNTER — Other Ambulatory Visit: Payer: Self-pay

## 2019-01-20 ENCOUNTER — Ambulatory Visit (HOSPITAL_COMMUNITY)
Admission: RE | Admit: 2019-01-20 | Discharge: 2019-01-20 | Disposition: A | Discharge status: Home / Self Care | Payer: Medicare Other | Source: Ambulatory Visit | Attending: Neurosurgery | Admitting: Neurosurgery

## 2019-01-20 DIAGNOSIS — M899 Disorder of bone, unspecified: Secondary | ICD-10-CM | POA: Insufficient documentation

## 2019-01-20 DIAGNOSIS — G939 Disorder of brain, unspecified: Secondary | ICD-10-CM | POA: Diagnosis not present

## 2019-01-20 LAB — CREATININE, SERUM
Creatinine, Ser: 0.95 mg/dL (ref 0.44–1.00)
GFR calc Af Amer: >60 mL/min (ref >60)
GFR calc non Af Amer: >60 mL/min (ref >60)

## 2019-01-20 MED ORDER — GADOBUTROL 1 MMOL/ML IV SOLN
7.0000 mL | Freq: Once | INTRAVENOUS | Status: AC | PRN
  Administered 2019-01-20: 11:00:00 7 mL via INTRAVENOUS

## 2019-02-05 DIAGNOSIS — M899 Disorder of bone, unspecified: Secondary | ICD-10-CM | POA: Diagnosis not present

## 2019-02-07 DIAGNOSIS — M810 Age-related osteoporosis without current pathological fracture: Secondary | ICD-10-CM | POA: Diagnosis not present

## 2019-02-07 DIAGNOSIS — Z1231 Encounter for screening mammogram for malignant neoplasm of breast: Secondary | ICD-10-CM | POA: Diagnosis not present

## 2019-03-19 ENCOUNTER — Ambulatory Visit: Payer: Medicare Other | Attending: Family Medicine | Admitting: Physical Therapy

## 2019-03-19 ENCOUNTER — Encounter: Payer: Self-pay | Admitting: Physical Therapy

## 2019-03-19 ENCOUNTER — Other Ambulatory Visit: Payer: Self-pay

## 2019-03-19 DIAGNOSIS — M6281 Muscle weakness (generalized): Secondary | ICD-10-CM | POA: Insufficient documentation

## 2019-03-19 DIAGNOSIS — M810 Age-related osteoporosis without current pathological fracture: Secondary | ICD-10-CM | POA: Diagnosis present

## 2019-03-19 NOTE — Therapy (Signed)
[address] [place], [place], [address] Phone: [phone]   Fax:  [phone]  Physical Therapy Evaluation  Patient Details  Name: Jamie Kirby MRN: [id number] Date of Birth: 1950-07-30 Referring Provider (PT): Addison Lank   Encounter Date: 03/19/2019  PT End of Session - 03/19/19 1014    Visit Number  1    Date for PT Re-Evaluation  05/17/19    PT Start Time  0918    PT Stop Time  1014    PT Time Calculation (min)  56 min    Activity Tolerance  Patient tolerated treatment well    Behavior During Therapy  [facility] for tasks assessed/performed       Past Medical History:  Diagnosis Date  . Anxiety   . Brain lesion    Left Occipital  . Depression   . Headache   . Osteoporosis   . Subdural hematoma (HCC)    left    Past Surgical History:  Procedure Laterality Date  . CRANIOTOMY Left 07/09/2018   Procedure: CRANIOTOMY HEMATOMA EVACUATION SUBDURAL- LEFT;  Surgeon: Consuella Lose, MD;  Location: [place];  Service: Neurosurgery;  Laterality: Left;  . TONSILLECTOMY      There were no vitals filed for this visit.   Subjective Assessment - 03/19/19 [id number]    Subjective  Patient reports that she was diagnosed with osteoporosis recently, she reports that she feels that she had osteopenia but due to covid, less activities, and an issue with a "brain bleed" caused less activity and then a recent bone scan showed the osteoporosis    Pertinent History  brain bleed May 2020    Patient Stated Goals  know what exercises to do    Currently in Pain?  No/denies         [facility] PT Assessment - 03/19/19 0001      Assessment   Medical Diagnosis  osteoporosis    Referring Provider (PT)  Addison Lank    Onset Date/Surgical Date  02/16/19    Prior Therapy  no      Precautions   Precautions  None      Balance Screen   Has the patient fallen in the past 6 months  No    Has the patient had a decrease in activity level  because of a fear of falling?   No    Is the patient reluctant to leave their home because of a fear of falling?   No      Home Environment   Additional Comments  housework and yardwork, 68 year old grandchild that she sometimes will lift      Prior Function   Level of Independence  Independent    Vocation  Retired    Leisure  1-2 miles walking daily      Posture/Postural Control   Posture Comments  slightly fwd head and rounded shoulders      ROM / Strength   AROM / PROM / Strength  AROM;Strength      AROM   Overall AROM Comments  WFL's      Strength   Overall Strength Comments  4-/5 for the legs and the arms, seemed weak unable to push me away or pull me to her      Palpation   Palpation comment  some mm atrophy, mild tender                 Objective measurements completed on examination: See above findings.  PT Short Term Goals - 03/19/19 1019      PT SHORT TERM GOAL #1   Title  independent with initial HEP    Time  2    Period  Weeks    Status  New        PT Long Term Goals - 03/19/19 1019      PT LONG TERM GOAL #1   Title  understand posture and body mechanics    Time  8    Period  Weeks    Status  New      PT LONG TERM GOAL #2   Title  independent with advanced HEP or gym program    Time  8    Period  Weeks    Status  New      PT LONG TERM GOAL #3   Title  increase UE strength to 4+/5    Time  8    Period  Weeks    Status  New             Plan - 03/19/19 1015    Clinical Impression Statement  Patient was recently diagnosed with osteoporosis, she reports that she had been active but due to covid she really stopped some activities, she also had a "brain bleed" in May and that really limited her activities, she has a lot of questions about exercise, how to start and be safe, she has concerns about the side effects of the osteoporosis medications and would rather try to build bone naturally and safely.  Her UE  strength was not great and had some LE strength deficits, mild postureal issues.    Stability/Clinical Decision Making  Stable/Uncomplicated    Clinical Decision Making  Low    Rehab Potential  Good    PT Frequency  1x / week    PT Duration  8 weeks    PT Treatment/Interventions  ADLs/Self Care Home Management;Functional mobility training;Therapeutic activities;Therapeutic exercise;Neuromuscular re-education;Patient/family education    PT Next Visit Plan  see how the HEP went, could advance HEP but would like to start some gym safely as she may return to the gym after covid    Consulted and Agree with Plan of Care  Patient       Patient will benefit from skilled therapeutic intervention in order to improve the following deficits and impairments:  Improper body mechanics, Postural dysfunction, Decreased strength  Visit Diagnosis: Muscle weakness (generalized) - Plan: PT plan of care cert/re-cert  Age-related osteoporosis without current pathological fracture - Plan: PT plan of care cert/re-cert     Problem List Patient Active Problem List   Diagnosis Date Noted  . Subdural hematoma ([place]) 05/17/2018  . SDH (subdural hematoma) ([place]) 05/17/2018    Sumner Boast., PT 03/19/2019, 10:22 AM  [place] [address] [place], [place], [address] Phone: [phone]   Fax:  [phone]  Name: Jamie Kirby MRN: [id number] Date of Birth: [birth year]

## 2019-03-19 NOTE — Patient Instructions (Signed)
Access Code: [email or web address]  URL: [email or web address]  Date: 03/19/2019  Prepared by: Lum Babe   Exercises Standing Bicep Curls with Dumbbells and Rotation - 10 reps - 3 sets - 1 hold - 1x daily - 3-4x weekly Standing Elbow Extension with Anchored Resistance - 10 reps - 3 sets - 3 hold - 1x daily - 3-4x weekly Scapular Retraction with Resistance - 10 reps - 3 sets - 3 hold - 1x daily - 3-4x weekly Standing Hip Abduction with Theraband Resistance - 10 reps - 3 sets - 1 hold - 1x daily - 3-4x weekly Standing Repeated Hip Extension with Resistance - 10 reps - 3 sets - 1 hold - 1x daily - 3-4x weekly Standing Hip Flexion with Resistance - 10 reps - 3 sets - 1 hold - 1x daily - 3-4x weekly Squat with Counter Support - 10 reps - 3 sets - 2 hold - 1x daily - 3-4x weekly Went over 8# farmer carry

## 2019-04-02 ENCOUNTER — Encounter: Payer: Self-pay | Admitting: Physical Therapy

## 2019-04-02 ENCOUNTER — Other Ambulatory Visit: Payer: Self-pay

## 2019-04-02 ENCOUNTER — Ambulatory Visit: Payer: Medicare Other | Admitting: Physical Therapy

## 2019-04-02 DIAGNOSIS — M6281 Muscle weakness (generalized): Secondary | ICD-10-CM

## 2019-04-02 DIAGNOSIS — M810 Age-related osteoporosis without current pathological fracture: Secondary | ICD-10-CM

## 2019-04-02 NOTE — Therapy (Signed)
[place], [place], [address] Phone: [phone]   Fax:  [phone]  Physical Therapy Treatment  Patient Details  Name: Jamie Kirby MRN: [id number] Date of Birth: 11-28-1950 Referring Provider (PT): Addison Lank   Encounter Date: 04/02/2019  PT End of Session - 04/02/19 1157    Visit Number  2    Date for PT Re-Evaluation  05/17/19    PT Start Time  0926    PT Stop Time  1011    PT Time Calculation (min)  45 min    Activity Tolerance  Patient tolerated treatment well    Behavior During Therapy  [facility] for tasks assessed/performed       Past Medical History:  Diagnosis Date  . Anxiety   . Brain lesion    Left Occipital  . Depression   . Headache   . Osteoporosis   . Subdural hematoma (HCC)    left    Past Surgical History:  Procedure Laterality Date  . CRANIOTOMY Left 07/09/2018   Procedure: CRANIOTOMY HEMATOMA EVACUATION SUBDURAL- LEFT;  Surgeon: Consuella Lose, MD;  Location: [place];  Service: Neurosurgery;  Laterality: Left;  . TONSILLECTOMY      There were no vitals filed for this visit.  Subjective Assessment - 04/02/19 0934    Subjective  Patient reports doing well, reports that she has some questions about the exerciss                       [facility] Adult PT Treatment/Exercise - 04/02/19 0001      Exercises   Exercises  Lumbar      Lumbar Exercises: Aerobic   Nustep  level 6 x 6 minutes      Lumbar Exercises: Standing   Other Standing Lumbar Exercises  resisted gait all directions    Other Standing Lumbar Exercises  16# farmer carry unilatreral, 4# overhead unilateral carry, 10# carry with weight at head level       Lumbar Exercises: Seated   Other Seated Lumbar Exercises  goblet squat with 10#, sit to stand with overhead press 10#             PT Education - 04/02/19 1157    Education Details  reviewed HEP and added goblet squat, farmer carry,  resisted gait    Person(s) Educated  Patient    Methods  Explanation;Demonstration;Tactile cues;Verbal cues;Handout    Comprehension  Verbalized understanding;Returned demonstration;Verbal cues required       PT Short Term Goals - 04/02/19 1200      PT SHORT TERM GOAL #1   Title  independent with initial HEP    Status  Achieved        PT Long Term Goals - 03/19/19 1019      PT LONG TERM GOAL #1   Title  understand posture and body mechanics    Time  8    Period  Weeks    Status  New      PT LONG TERM GOAL #2   Title  independent with advanced HEP or gym program    Time  8    Period  Weeks    Status  New      PT LONG TERM GOAL #3   Title  increase UE strength to 4+/5    Time  8    Period  Weeks    Status  New  Plan - 04/02/19 1158    Clinical Impression Statement  Patient is doing well, had some questions about the exercises, reports doing arms easily at home, reports not doing much with her legs, aadded legs and some core, really cautioned her about no bending, flexion or twisting    PT Next Visit Plan  gym exercises and ball exercises    Consulted and Agree with Plan of Care  Patient       Patient will benefit from skilled therapeutic intervention in order to improve the following deficits and impairments:  Improper body mechanics, Postural dysfunction, Decreased strength  Visit Diagnosis: Muscle weakness (generalized)  Age-related osteoporosis without current pathological fracture     Problem List Patient Active Problem List   Diagnosis Date Noted  . Subdural hematoma ([place]) 05/17/2018  . SDH (subdural hematoma) ([place]) 05/17/2018    Sumner Boast., PT 04/02/2019, 12:01 PM  [place] [address] [place], [place], [address] Phone: [phone]   Fax:  [phone]  Name: Jamie Kirby MRN: [id number] Date of Birth: Dec 09, 1950

## 2019-04-16 ENCOUNTER — Ambulatory Visit: Payer: Medicare Other | Attending: Family Medicine | Admitting: Physical Therapy

## 2019-04-16 ENCOUNTER — Other Ambulatory Visit: Payer: Self-pay

## 2019-04-16 ENCOUNTER — Encounter: Payer: Self-pay | Admitting: Physical Therapy

## 2019-04-16 DIAGNOSIS — M6281 Muscle weakness (generalized): Secondary | ICD-10-CM | POA: Insufficient documentation

## 2019-04-16 DIAGNOSIS — M810 Age-related osteoporosis without current pathological fracture: Secondary | ICD-10-CM | POA: Diagnosis present

## 2019-04-16 NOTE — Therapy (Signed)
[place] [address], [place], [address] Phone: [phone]   Fax:  [phone]  Physical Therapy Treatment  Patient Details  Name: Jamie Kirby MRN: [id number] Date of Birth: [birth year] Referring Provider (PT): Addison Lank   Encounter Date: 04/16/2019  PT End of Session - 04/16/19 1009    Visit Number  3    PT Start Time  0930    PT Stop Time  1011    PT Time Calculation (min)  41 min    Activity Tolerance  Patient tolerated treatment well    Behavior During Therapy  [facility] for tasks assessed/performed       Past Medical History:  Diagnosis Date  . Anxiety   . Brain lesion    Left Occipital  . Depression   . Headache   . Osteoporosis   . Subdural hematoma (HCC)    left    Past Surgical History:  Procedure Laterality Date  . CRANIOTOMY Left 07/09/2018   Procedure: CRANIOTOMY HEMATOMA EVACUATION SUBDURAL- LEFT;  Surgeon: Consuella Lose, MD;  Location: [facility];  Service: Neurosurgery;  Laterality: Left;  . TONSILLECTOMY      There were no vitals filed for this visit.  Subjective Assessment - 04/16/19 0939    Subjective  Reports that she is feeling stronger, that she feels she can do on her own, but has a number of questions and bought some new equip,ment that she wants to go over    Currently in Pain?  No/denies                       [facility] Adult PT Treatment/Exercise - 04/16/19 0001      Lumbar Exercises: Aerobic   Nustep  level 6 x 6 minutes      Lumbar Exercises: Standing   Other Standing Lumbar Exercises  ball wall squat, wall pushups      Lumbar Exercises: Supine   Other Supine Lumbar Exercises  patient got a ball, feet on ball K2C, trunk rotations controlled, small bridges and isometric abs      Lumbar Exercises: Prone   Other Prone Lumbar Exercises  modified planks and full planks cues for form and to breathe             PT Education - 04/16/19 1009    Education  Details  patient had more questions about HEP and exercises, educated her on safety and how to advance as needed    Person(s) Educated  Patient    Methods  Demonstration;Explanation;Verbal cues    Comprehension  Verbalized understanding;Returned demonstration       PT Short Term Goals - 04/02/19 1200      PT SHORT TERM GOAL #1   Title  independent with initial HEP    Status  Achieved        PT Long Term Goals - 04/16/19 1011      PT LONG TERM GOAL #1   Title  understand posture and body mechanics    Status  Achieved      PT LONG TERM GOAL #2   Title  independent with advanced HEP or gym program    Status  Achieved      PT LONG TERM GOAL #3   Title  increase UE strength to 4+/5    Status  Achieved            Plan - 04/16/19 1010    Clinical Impression Statement  Patient is doing great, she reports feeling stronger and more stable, she reports that she feels that PT was very helpful to the education process of the osteoporosis    PT Next Visit Plan  D/C, goals met    Consulted and Agree with Plan of Care  Patient       Patient will benefit from skilled therapeutic intervention in order to improve the following deficits and impairments:     Visit Diagnosis: Muscle weakness (generalized)  Age-related osteoporosis without current pathological fracture     Problem List Patient Active Problem List   Diagnosis Date Noted  . Subdural hematoma ([place]) 05/17/2018  . SDH (subdural hematoma) ([place]) 05/17/2018    Sumner Boast., PT 04/16/2019, 10:11 AM  [address] [place], [place], [address] Phone: [phone]   Fax:  [phone]  Name: Jamie Kirby MRN: [id number] Date of Birth: 1950-11-14

## 2019-12-05 DIAGNOSIS — H2511 Age-related nuclear cataract, right eye: Secondary | ICD-10-CM | POA: Diagnosis not present

## 2019-12-22 DIAGNOSIS — H2512 Age-related nuclear cataract, left eye: Secondary | ICD-10-CM | POA: Diagnosis not present

## 2020-01-20 ENCOUNTER — Other Ambulatory Visit (HOSPITAL_COMMUNITY): Payer: Self-pay | Admitting: Physician Assistant

## 2020-01-20 ENCOUNTER — Other Ambulatory Visit: Payer: Self-pay | Admitting: Physician Assistant

## 2020-01-20 DIAGNOSIS — M899 Disorder of bone, unspecified: Secondary | ICD-10-CM

## 2020-02-04 ENCOUNTER — Ambulatory Visit (HOSPITAL_COMMUNITY)
Admission: RE | Admit: 2020-02-04 | Discharge: 2020-02-04 | Disposition: A | Discharge status: Home / Self Care | Payer: Medicare Other | Source: Ambulatory Visit | Attending: Physician Assistant | Admitting: Physician Assistant

## 2020-02-04 ENCOUNTER — Other Ambulatory Visit: Payer: Self-pay

## 2020-02-04 DIAGNOSIS — M899 Disorder of bone, unspecified: Secondary | ICD-10-CM | POA: Diagnosis not present

## 2020-02-04 MED ORDER — GADOBUTROL 1 MMOL/ML IV SOLN
6.0000 mL | Freq: Once | INTRAVENOUS | Status: AC | PRN
  Administered 2020-02-04: 6 mL via INTRAVENOUS

## 2020-02-22 IMAGING — MR MR HEAD WO/W CM
10 of 13 series · 30 of 48 positions shown · IV contrast (gadavist)
Comparison: 10/19/2017 and 09/08/2017 MRI head. 09/08/2017 CT head.

CLINICAL DATA: 67 y/o  F; follow-up of skull lesion.

EXAM:
MRI HEAD WITHOUT AND WITH CONTRAST
TECHNIQUE: Multiplanar, multiecho pulse sequences of the brain and surrounding
structures were obtained without and with intravenous contrast.
CONTRAST:  5 cc Gadavist.

[Series 3: DWI · axial · 3.0mm · 0.94mm/px · z∈[-76,+58]mm · 5 of 92 slices shown (1 of 2)]
[im 1/92]
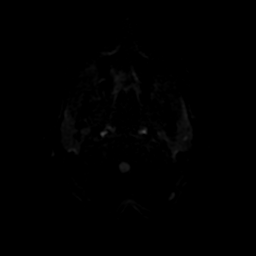
[im 23/92]
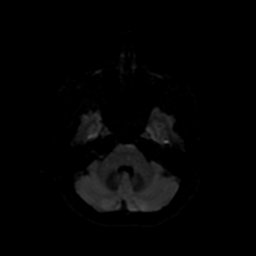
[im 46/92]
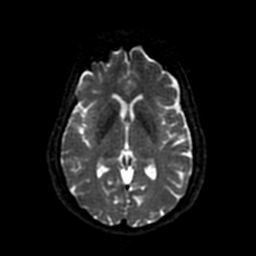
[im 69/92]
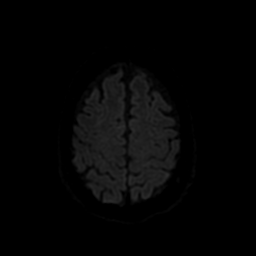
[im 92/92]
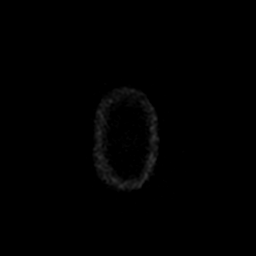

[Series 4: DWI · coronal · 4.0mm · 0.94mm/px · 5 of 72 slices shown (2 of 2)]
[im 1/72]
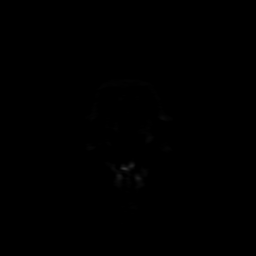
[im 18/72]
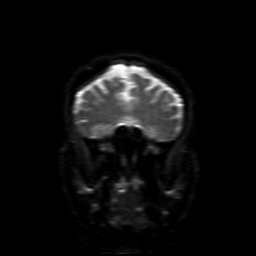
[im 36/72]
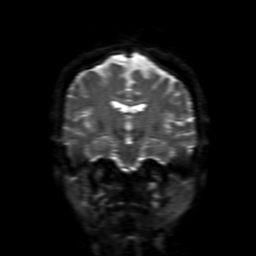
[im 54/72]
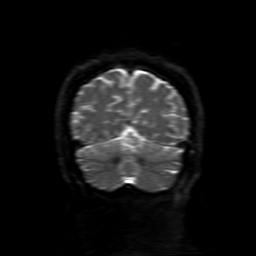
[im 72/72]
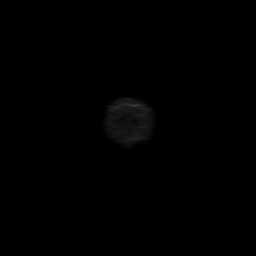

[Series 5: FLAIR · sagittal · 5.0mm · 0.47mm/px · 2 of 23 slices shown (1 of 2)]
[im 1/23]
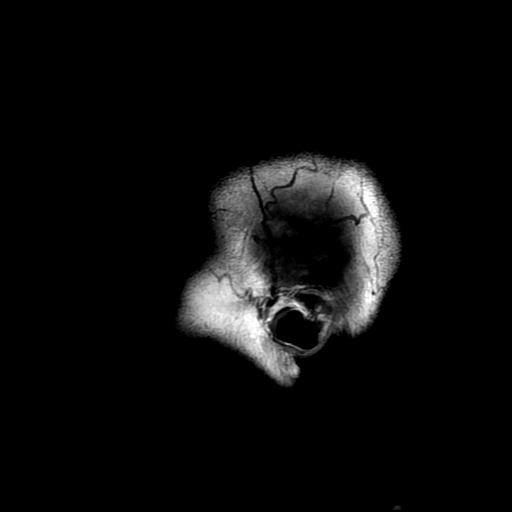
[im 23/23]
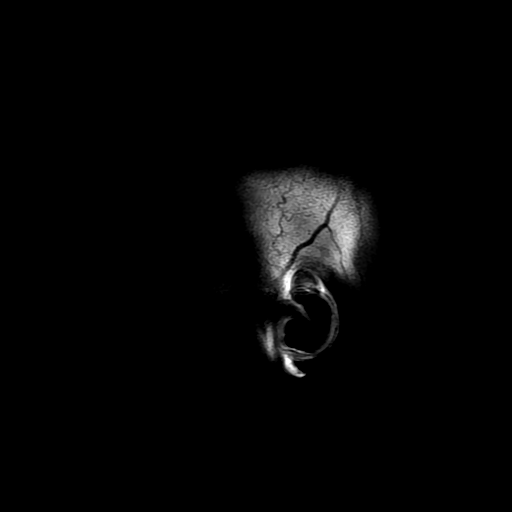

[Series 6: T2 · axial · 5.0mm · 0.47mm/px · z∈[-86,+63]mm · 2 of 26 slices shown]
[im 1/26]
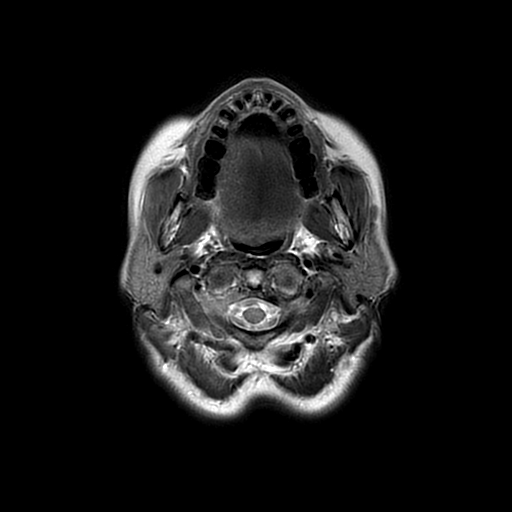
[im 26/26]
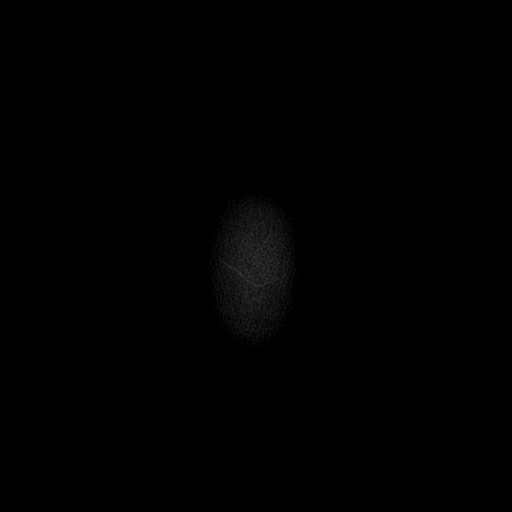

[Series 7: FLAIR · axial · 5.0mm · 0.47mm/px · z∈[-86,+63]mm · 2 of 26 slices shown (2 of 2)]
[im 1/26]
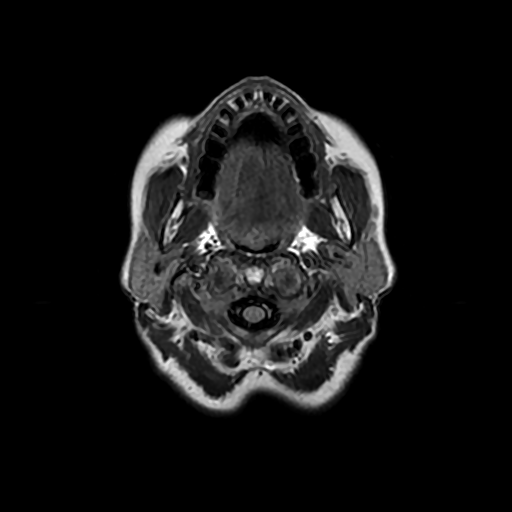
[im 26/26]
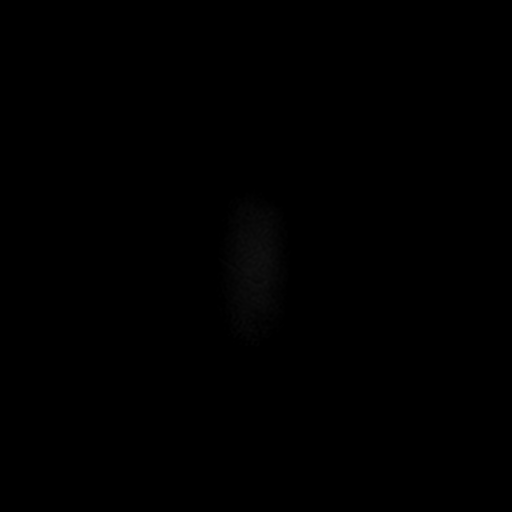

[Series 8: (person_name) · axial · 3.0mm · 0.47mm/px · z∈[-84,-11]mm · 4 of 100 slices shown]
[im 1/100]
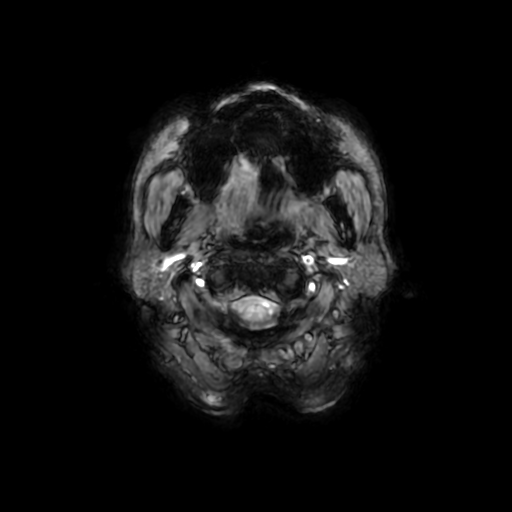
[im 17/100]
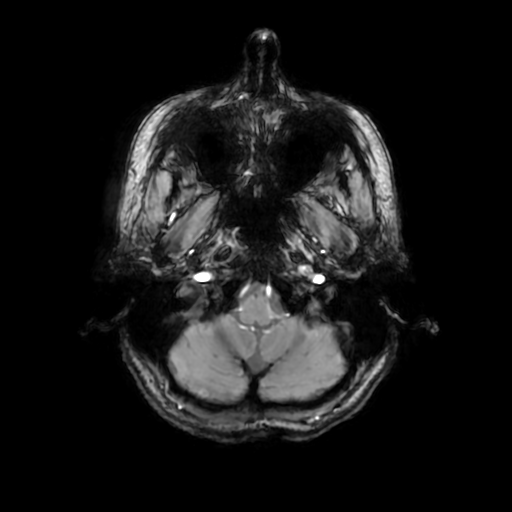
[im 34/100]
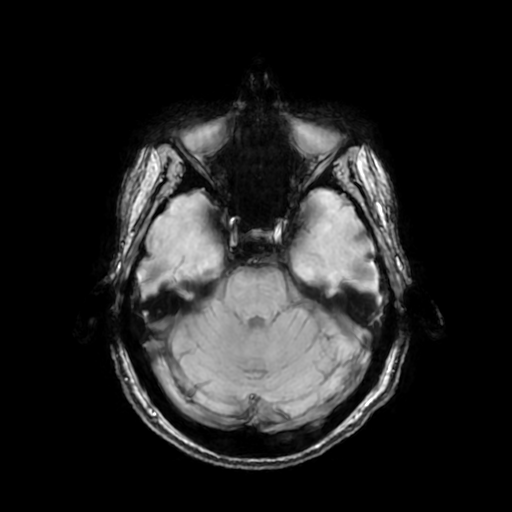
[im 50/100]
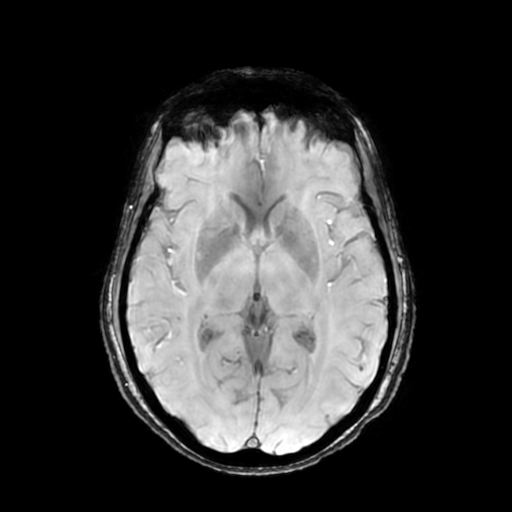

[Series 10: T2 post-contrast · coronal · 5.0mm · 0.39mm/px · 2 of 30 slices shown]
[im 1/30]
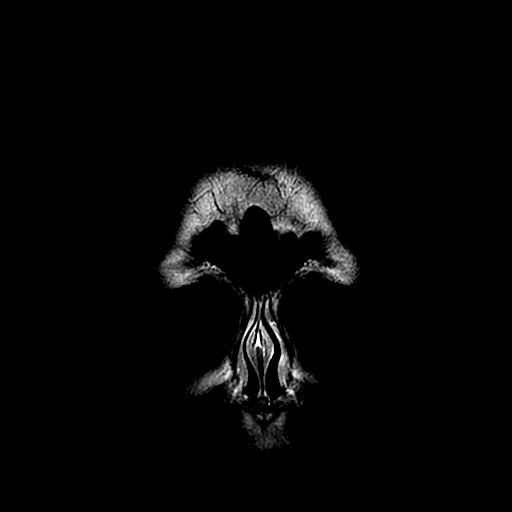
[im 30/30]
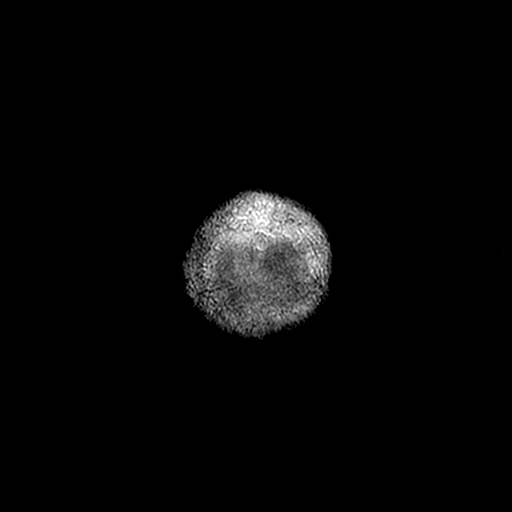

[Series 12: T1 · coronal · 5.0mm · 0.39mm/px · 2 of 30 slices shown]
[im 1/30]
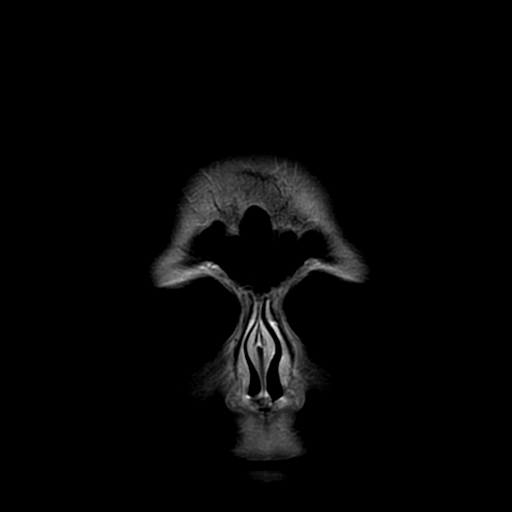
[im 30/30]
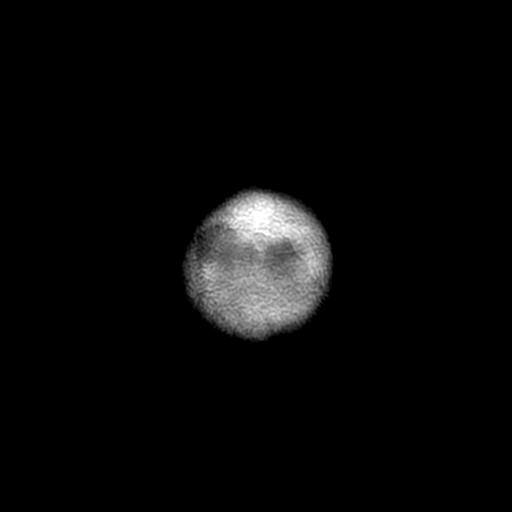

[Series 350: ADC · axial · 3.0mm · 0.94mm/px · z∈[-76,+58]mm · 3 of 45 slices shown (1 of 2)]
[im 1/45]
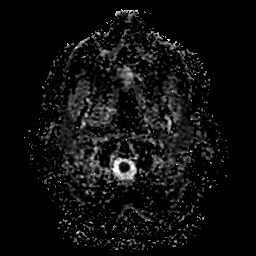
[im 23/45]
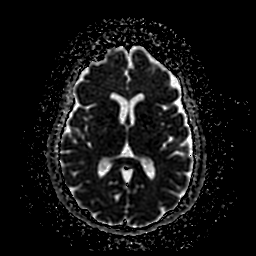
[im 45/45]
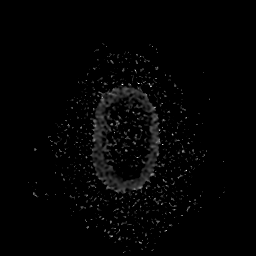

[Series 450: ADC · coronal · 4.0mm · 0.94mm/px · 3 of 36 slices shown (2 of 2)]
[im 1/36]
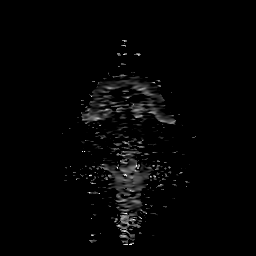
[im 18/36]
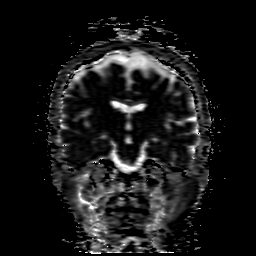
[im 36/36]
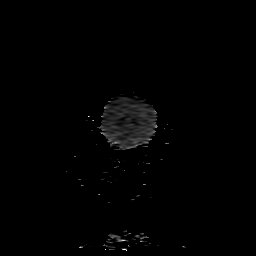

[30 of 48 positions shown; findings below may reference images not displayed]

FINDINGS: Brain: No acute infarction, hemorrhage, hydrocephalus, extra-axial
collection or mass lesion. No abnormal enhancement of the brain
parenchyma.

Vascular: Normal flow voids.

Skull and upper cervical spine: Stable left occipital bone avidly
enhancing mass with increased T2 and intermediate T1 signal
measuring 9 x 12 x 10 mm (AP x ML x CC series 11, image 31 and
series 12, image 3).

Sinuses/Orbits: Right maxillary sinus mild mucosal thickening. No
abnormal signal of the additional paranasal sinuses or the mastoid
air cells. Orbits are unremarkable.

Other: None.
IMPRESSION: 1. Stable 12 mm lesion within the left occipital bone. Differential
includes meningioma, metastasis, or benign lesion such as hemangioma
as described on prior study. Stability favors benign etiology such
as meningioma or hemangioma.
2. Normal MRI of the brain parenchyma.

## 2020-04-07 DIAGNOSIS — H0288A Meibomian gland dysfunction right eye, upper and lower eyelids: Secondary | ICD-10-CM | POA: Diagnosis not present

## 2020-04-07 DIAGNOSIS — H16143 Punctate keratitis, bilateral: Secondary | ICD-10-CM | POA: Diagnosis not present

## 2020-04-07 DIAGNOSIS — H01114 Allergic dermatitis of left upper eyelid: Secondary | ICD-10-CM | POA: Diagnosis not present

## 2020-04-07 DIAGNOSIS — H0288B Meibomian gland dysfunction left eye, upper and lower eyelids: Secondary | ICD-10-CM | POA: Diagnosis not present

## 2020-07-01 DIAGNOSIS — H1045 Other chronic allergic conjunctivitis: Secondary | ICD-10-CM | POA: Diagnosis not present

## 2020-08-03 DIAGNOSIS — H26493 Other secondary cataract, bilateral: Secondary | ICD-10-CM | POA: Diagnosis not present

## 2020-08-03 DIAGNOSIS — H401131 Primary open-angle glaucoma, bilateral, mild stage: Secondary | ICD-10-CM | POA: Diagnosis not present

## 2020-08-03 DIAGNOSIS — H04123 Dry eye syndrome of bilateral lacrimal glands: Secondary | ICD-10-CM | POA: Diagnosis not present

## 2020-08-12 DIAGNOSIS — Z23 Encounter for immunization: Secondary | ICD-10-CM | POA: Diagnosis not present

## 2020-09-09 DIAGNOSIS — Z1283 Encounter for screening for malignant neoplasm of skin: Secondary | ICD-10-CM | POA: Diagnosis not present

## 2020-09-09 DIAGNOSIS — D225 Melanocytic nevi of trunk: Secondary | ICD-10-CM | POA: Diagnosis not present

## 2020-10-05 DIAGNOSIS — Z20822 Contact with and (suspected) exposure to covid-19: Secondary | ICD-10-CM | POA: Diagnosis not present

## 2020-11-10 DIAGNOSIS — H401122 Primary open-angle glaucoma, left eye, moderate stage: Secondary | ICD-10-CM | POA: Diagnosis not present

## 2020-11-10 DIAGNOSIS — H401111 Primary open-angle glaucoma, right eye, mild stage: Secondary | ICD-10-CM | POA: Diagnosis not present

## 2020-11-18 DIAGNOSIS — Z6821 Body mass index (BMI) 21.0-21.9, adult: Secondary | ICD-10-CM | POA: Diagnosis not present

## 2020-11-18 DIAGNOSIS — Z01419 Encounter for gynecological examination (general) (routine) without abnormal findings: Secondary | ICD-10-CM | POA: Diagnosis not present

## 2020-12-22 DIAGNOSIS — M810 Age-related osteoporosis without current pathological fracture: Secondary | ICD-10-CM | POA: Diagnosis not present

## 2020-12-25 DIAGNOSIS — Z23 Encounter for immunization: Secondary | ICD-10-CM | POA: Diagnosis not present

## 2020-12-31 DIAGNOSIS — G453 Amaurosis fugax: Secondary | ICD-10-CM | POA: Diagnosis not present

## 2020-12-31 DIAGNOSIS — M810 Age-related osteoporosis without current pathological fracture: Secondary | ICD-10-CM | POA: Diagnosis not present

## 2020-12-31 DIAGNOSIS — Z0000 Encounter for general adult medical examination without abnormal findings: Secondary | ICD-10-CM | POA: Diagnosis not present

## 2020-12-31 DIAGNOSIS — H04123 Dry eye syndrome of bilateral lacrimal glands: Secondary | ICD-10-CM | POA: Diagnosis not present

## 2020-12-31 DIAGNOSIS — F419 Anxiety disorder, unspecified: Secondary | ICD-10-CM | POA: Diagnosis not present

## 2021-01-13 DIAGNOSIS — Z23 Encounter for immunization: Secondary | ICD-10-CM | POA: Diagnosis not present

## 2021-01-18 ENCOUNTER — Other Ambulatory Visit (HOSPITAL_BASED_OUTPATIENT_CLINIC_OR_DEPARTMENT_OTHER): Payer: Self-pay | Admitting: Neurosurgery

## 2021-01-18 ENCOUNTER — Other Ambulatory Visit (HOSPITAL_COMMUNITY): Payer: Self-pay | Admitting: Neurosurgery

## 2021-01-18 DIAGNOSIS — M899 Disorder of bone, unspecified: Secondary | ICD-10-CM

## 2021-02-16 DIAGNOSIS — M8588 Other specified disorders of bone density and structure, other site: Secondary | ICD-10-CM | POA: Diagnosis not present

## 2021-02-16 DIAGNOSIS — Z1231 Encounter for screening mammogram for malignant neoplasm of breast: Secondary | ICD-10-CM | POA: Diagnosis not present

## 2021-02-16 DIAGNOSIS — M810 Age-related osteoporosis without current pathological fracture: Secondary | ICD-10-CM | POA: Diagnosis not present

## 2021-02-16 DIAGNOSIS — Z780 Asymptomatic menopausal state: Secondary | ICD-10-CM | POA: Diagnosis not present

## 2021-02-19 ENCOUNTER — Ambulatory Visit (HOSPITAL_BASED_OUTPATIENT_CLINIC_OR_DEPARTMENT_OTHER): Payer: Medicare Other

## 2021-02-20 DIAGNOSIS — Z20828 Contact with and (suspected) exposure to other viral communicable diseases: Secondary | ICD-10-CM | POA: Diagnosis not present

## 2021-02-24 DIAGNOSIS — R059 Cough, unspecified: Secondary | ICD-10-CM | POA: Diagnosis not present

## 2021-02-24 DIAGNOSIS — Z6820 Body mass index (BMI) 20.0-20.9, adult: Secondary | ICD-10-CM | POA: Diagnosis not present

## 2021-02-24 DIAGNOSIS — U071 COVID-19: Secondary | ICD-10-CM | POA: Diagnosis not present

## 2021-02-25 DIAGNOSIS — Z20822 Contact with and (suspected) exposure to covid-19: Secondary | ICD-10-CM | POA: Diagnosis not present

## 2021-03-05 ENCOUNTER — Ambulatory Visit (HOSPITAL_BASED_OUTPATIENT_CLINIC_OR_DEPARTMENT_OTHER): Payer: Medicare Other

## 2021-03-12 ENCOUNTER — Other Ambulatory Visit: Payer: Self-pay

## 2021-03-12 ENCOUNTER — Ambulatory Visit (HOSPITAL_BASED_OUTPATIENT_CLINIC_OR_DEPARTMENT_OTHER)
Admission: RE | Admit: 2021-03-12 | Discharge: 2021-03-12 | Disposition: A | Discharge status: Home / Self Care | Payer: Medicare Other | Source: Ambulatory Visit | Attending: Neurosurgery | Admitting: Neurosurgery

## 2021-03-12 DIAGNOSIS — M899 Disorder of bone, unspecified: Secondary | ICD-10-CM | POA: Diagnosis not present

## 2021-03-12 DIAGNOSIS — D329 Benign neoplasm of meninges, unspecified: Secondary | ICD-10-CM | POA: Diagnosis not present

## 2021-03-12 DIAGNOSIS — I619 Nontraumatic intracerebral hemorrhage, unspecified: Secondary | ICD-10-CM | POA: Diagnosis not present

## 2021-03-12 MED ORDER — GADOBUTROL 1 MMOL/ML IV SOLN
4.6000 mL | Freq: Once | INTRAVENOUS | Status: AC | PRN
  Administered 2021-03-12: 4.6 mL via INTRAVENOUS

## 2021-03-16 DIAGNOSIS — M899 Disorder of bone, unspecified: Secondary | ICD-10-CM | POA: Diagnosis not present

## 2021-03-16 DIAGNOSIS — R030 Elevated blood-pressure reading, without diagnosis of hypertension: Secondary | ICD-10-CM | POA: Diagnosis not present

## 2021-05-17 DIAGNOSIS — H401131 Primary open-angle glaucoma, bilateral, mild stage: Secondary | ICD-10-CM | POA: Diagnosis not present

## 2021-07-06 DIAGNOSIS — Z20822 Contact with and (suspected) exposure to covid-19: Secondary | ICD-10-CM | POA: Diagnosis not present

## 2021-11-17 DIAGNOSIS — H401122 Primary open-angle glaucoma, left eye, moderate stage: Secondary | ICD-10-CM | POA: Diagnosis not present

## 2021-11-18 DIAGNOSIS — Z23 Encounter for immunization: Secondary | ICD-10-CM | POA: Diagnosis not present

## 2021-11-21 DIAGNOSIS — Z01419 Encounter for gynecological examination (general) (routine) without abnormal findings: Secondary | ICD-10-CM | POA: Diagnosis not present

## 2021-11-21 DIAGNOSIS — Z6820 Body mass index (BMI) 20.0-20.9, adult: Secondary | ICD-10-CM | POA: Diagnosis not present

## 2021-12-07 DIAGNOSIS — D259 Leiomyoma of uterus, unspecified: Secondary | ICD-10-CM | POA: Diagnosis not present

## 2021-12-30 DIAGNOSIS — Z23 Encounter for immunization: Secondary | ICD-10-CM | POA: Diagnosis not present

## 2022-02-13 DIAGNOSIS — M810 Age-related osteoporosis without current pathological fracture: Secondary | ICD-10-CM | POA: Diagnosis not present

## 2022-02-16 DIAGNOSIS — H401122 Primary open-angle glaucoma, left eye, moderate stage: Secondary | ICD-10-CM | POA: Diagnosis not present

## 2022-02-16 DIAGNOSIS — H401111 Primary open-angle glaucoma, right eye, mild stage: Secondary | ICD-10-CM | POA: Diagnosis not present

## 2022-02-20 DIAGNOSIS — Z1211 Encounter for screening for malignant neoplasm of colon: Secondary | ICD-10-CM | POA: Diagnosis not present

## 2022-02-20 DIAGNOSIS — M810 Age-related osteoporosis without current pathological fracture: Secondary | ICD-10-CM | POA: Diagnosis not present

## 2022-02-20 DIAGNOSIS — Z0000 Encounter for general adult medical examination without abnormal findings: Secondary | ICD-10-CM | POA: Diagnosis not present

## 2022-02-20 DIAGNOSIS — Z1331 Encounter for screening for depression: Secondary | ICD-10-CM | POA: Diagnosis not present

## 2022-02-20 DIAGNOSIS — F419 Anxiety disorder, unspecified: Secondary | ICD-10-CM | POA: Diagnosis not present

## 2022-02-20 DIAGNOSIS — H04123 Dry eye syndrome of bilateral lacrimal glands: Secondary | ICD-10-CM | POA: Diagnosis not present

## 2022-02-22 DIAGNOSIS — Z1231 Encounter for screening mammogram for malignant neoplasm of breast: Secondary | ICD-10-CM | POA: Diagnosis not present

## 2022-05-23 DIAGNOSIS — H401122 Primary open-angle glaucoma, left eye, moderate stage: Secondary | ICD-10-CM | POA: Diagnosis not present

## 2022-06-12 DIAGNOSIS — Z23 Encounter for immunization: Secondary | ICD-10-CM | POA: Diagnosis not present

## 2022-07-20 DIAGNOSIS — L821 Other seborrheic keratosis: Secondary | ICD-10-CM | POA: Diagnosis not present

## 2022-07-20 DIAGNOSIS — L570 Actinic keratosis: Secondary | ICD-10-CM | POA: Diagnosis not present

## 2022-07-20 DIAGNOSIS — L814 Other melanin hyperpigmentation: Secondary | ICD-10-CM | POA: Diagnosis not present

## 2022-07-20 DIAGNOSIS — D229 Melanocytic nevi, unspecified: Secondary | ICD-10-CM | POA: Diagnosis not present

## 2022-08-16 DIAGNOSIS — H16143 Punctate keratitis, bilateral: Secondary | ICD-10-CM | POA: Diagnosis not present

## 2022-09-19 DIAGNOSIS — L2089 Other atopic dermatitis: Secondary | ICD-10-CM | POA: Diagnosis not present

## 2022-09-19 DIAGNOSIS — L570 Actinic keratosis: Secondary | ICD-10-CM | POA: Diagnosis not present

## 2022-10-10 DIAGNOSIS — Z1211 Encounter for screening for malignant neoplasm of colon: Secondary | ICD-10-CM | POA: Diagnosis not present

## 2022-10-10 DIAGNOSIS — K648 Other hemorrhoids: Secondary | ICD-10-CM | POA: Diagnosis not present

## 2022-11-21 DIAGNOSIS — H401122 Primary open-angle glaucoma, left eye, moderate stage: Secondary | ICD-10-CM | POA: Diagnosis not present

## 2022-11-27 ENCOUNTER — Other Ambulatory Visit (HOSPITAL_BASED_OUTPATIENT_CLINIC_OR_DEPARTMENT_OTHER): Payer: Self-pay | Admitting: Obstetrics and Gynecology

## 2022-11-27 DIAGNOSIS — Z8249 Family history of ischemic heart disease and other diseases of the circulatory system: Secondary | ICD-10-CM

## 2022-11-27 DIAGNOSIS — N959 Unspecified menopausal and perimenopausal disorder: Secondary | ICD-10-CM | POA: Diagnosis not present

## 2022-11-27 DIAGNOSIS — Z6820 Body mass index (BMI) 20.0-20.9, adult: Secondary | ICD-10-CM | POA: Diagnosis not present

## 2022-11-27 DIAGNOSIS — Z01419 Encounter for gynecological examination (general) (routine) without abnormal findings: Secondary | ICD-10-CM | POA: Diagnosis not present

## 2022-12-02 DIAGNOSIS — Z23 Encounter for immunization: Secondary | ICD-10-CM | POA: Diagnosis not present

## 2022-12-04 ENCOUNTER — Ambulatory Visit (HOSPITAL_BASED_OUTPATIENT_CLINIC_OR_DEPARTMENT_OTHER)
Admission: RE | Admit: 2022-12-04 | Discharge: 2022-12-04 | Disposition: A | Discharge status: Home / Self Care | Payer: Medicare Other | Source: Ambulatory Visit | Attending: Obstetrics and Gynecology | Admitting: Obstetrics and Gynecology

## 2022-12-04 DIAGNOSIS — Z8249 Family history of ischemic heart disease and other diseases of the circulatory system: Secondary | ICD-10-CM | POA: Insufficient documentation

## 2022-12-29 DIAGNOSIS — Z23 Encounter for immunization: Secondary | ICD-10-CM | POA: Diagnosis not present

## 2023-01-10 ENCOUNTER — Other Ambulatory Visit (HOSPITAL_BASED_OUTPATIENT_CLINIC_OR_DEPARTMENT_OTHER): Payer: Self-pay | Admitting: Neurosurgery

## 2023-01-10 DIAGNOSIS — M899 Disorder of bone, unspecified: Secondary | ICD-10-CM

## 2023-01-17 ENCOUNTER — Ambulatory Visit (HOSPITAL_BASED_OUTPATIENT_CLINIC_OR_DEPARTMENT_OTHER)
Admission: RE | Admit: 2023-01-17 | Discharge: 2023-01-17 | Disposition: A | Discharge status: Home / Self Care | Payer: Medicare Other | Source: Ambulatory Visit | Attending: Neurosurgery | Admitting: Neurosurgery

## 2023-01-17 DIAGNOSIS — M899 Disorder of bone, unspecified: Secondary | ICD-10-CM | POA: Diagnosis not present

## 2023-01-17 DIAGNOSIS — M8488 Other disorders of continuity of bone, other site: Secondary | ICD-10-CM | POA: Diagnosis not present

## 2023-01-17 MED ORDER — GADOBUTROL 1 MMOL/ML IV SOLN
4.0000 mL | Freq: Once | INTRAVENOUS | Status: AC | PRN
  Administered 2023-01-17: 4 mL via INTRAVENOUS

## 2023-02-26 DIAGNOSIS — M899 Disorder of bone, unspecified: Secondary | ICD-10-CM | POA: Diagnosis not present

## 2023-02-26 DIAGNOSIS — Z1231 Encounter for screening mammogram for malignant neoplasm of breast: Secondary | ICD-10-CM | POA: Diagnosis not present

## 2023-02-26 DIAGNOSIS — M810 Age-related osteoporosis without current pathological fracture: Secondary | ICD-10-CM | POA: Diagnosis not present

## 2023-02-26 DIAGNOSIS — M8588 Other specified disorders of bone density and structure, other site: Secondary | ICD-10-CM | POA: Diagnosis not present

## 2023-03-30 DIAGNOSIS — M810 Age-related osteoporosis without current pathological fracture: Secondary | ICD-10-CM | POA: Diagnosis not present

## 2023-04-02 DIAGNOSIS — M810 Age-related osteoporosis without current pathological fracture: Secondary | ICD-10-CM | POA: Diagnosis not present

## 2023-04-02 DIAGNOSIS — F419 Anxiety disorder, unspecified: Secondary | ICD-10-CM | POA: Diagnosis not present

## 2023-04-02 DIAGNOSIS — Z13220 Encounter for screening for lipoid disorders: Secondary | ICD-10-CM | POA: Diagnosis not present

## 2023-04-02 DIAGNOSIS — Z1331 Encounter for screening for depression: Secondary | ICD-10-CM | POA: Diagnosis not present

## 2023-04-02 DIAGNOSIS — Z23 Encounter for immunization: Secondary | ICD-10-CM | POA: Diagnosis not present

## 2023-04-02 DIAGNOSIS — H04123 Dry eye syndrome of bilateral lacrimal glands: Secondary | ICD-10-CM | POA: Diagnosis not present

## 2023-04-02 DIAGNOSIS — Z0000 Encounter for general adult medical examination without abnormal findings: Secondary | ICD-10-CM | POA: Diagnosis not present

## 2023-05-15 DIAGNOSIS — H401122 Primary open-angle glaucoma, left eye, moderate stage: Secondary | ICD-10-CM | POA: Diagnosis not present

## 2023-06-13 DIAGNOSIS — L638 Other alopecia areata: Secondary | ICD-10-CM | POA: Diagnosis not present

## 2023-07-23 DIAGNOSIS — L814 Other melanin hyperpigmentation: Secondary | ICD-10-CM | POA: Diagnosis not present

## 2023-07-23 DIAGNOSIS — D229 Melanocytic nevi, unspecified: Secondary | ICD-10-CM | POA: Diagnosis not present

## 2023-07-23 DIAGNOSIS — L638 Other alopecia areata: Secondary | ICD-10-CM | POA: Diagnosis not present

## 2023-08-27 DIAGNOSIS — L638 Other alopecia areata: Secondary | ICD-10-CM | POA: Diagnosis not present

## 2023-10-03 DIAGNOSIS — L638 Other alopecia areata: Secondary | ICD-10-CM | POA: Diagnosis not present

## 2023-11-22 DIAGNOSIS — H401111 Primary open-angle glaucoma, right eye, mild stage: Secondary | ICD-10-CM | POA: Diagnosis not present

## 2023-11-26 DIAGNOSIS — L638 Other alopecia areata: Secondary | ICD-10-CM | POA: Diagnosis not present

## 2023-12-11 DIAGNOSIS — Z01419 Encounter for gynecological examination (general) (routine) without abnormal findings: Secondary | ICD-10-CM | POA: Diagnosis not present

## 2023-12-11 DIAGNOSIS — Z6820 Body mass index (BMI) 20.0-20.9, adult: Secondary | ICD-10-CM | POA: Diagnosis not present

## 2024-01-03 DIAGNOSIS — Z23 Encounter for immunization: Secondary | ICD-10-CM | POA: Diagnosis not present

## 2024-01-09 DIAGNOSIS — L638 Other alopecia areata: Secondary | ICD-10-CM | POA: Diagnosis not present

## 2024-02-18 DIAGNOSIS — L638 Other alopecia areata: Secondary | ICD-10-CM | POA: Diagnosis not present
